# Patient Record
Sex: Male | Born: 1960 | Race: Black or African American | Hispanic: No
Health system: Northeastern US, Academic
[De-identification: ages and names within clinical notes are randomized; demographics above are authoritative.]

## PROBLEM LIST (undated history)

## (undated) DIAGNOSIS — J45909 Unspecified asthma, uncomplicated: Secondary | ICD-10-CM

## (undated) HISTORY — PX: LIPOMA RESECTION: SHX23

## (undated) HISTORY — DX: Unspecified asthma, uncomplicated: J45.909

---

## 2006-01-29 DIAGNOSIS — K219 Gastro-esophageal reflux disease without esophagitis: Secondary | ICD-10-CM

## 2006-01-29 DIAGNOSIS — J45909 Unspecified asthma, uncomplicated: Secondary | ICD-10-CM | POA: Insufficient documentation

## 2006-01-29 DIAGNOSIS — J309 Allergic rhinitis, unspecified: Secondary | ICD-10-CM | POA: Insufficient documentation

## 2006-01-29 HISTORY — DX: Gastro-esophageal reflux disease without esophagitis: K21.9

## 2006-03-27 DIAGNOSIS — IMO0001 Reserved for inherently not codable concepts without codable children: Secondary | ICD-10-CM | POA: Insufficient documentation

## 2006-03-27 DIAGNOSIS — J209 Acute bronchitis, unspecified: Secondary | ICD-10-CM | POA: Insufficient documentation

## 2006-10-31 DIAGNOSIS — H60399 Other infective otitis externa, unspecified ear: Secondary | ICD-10-CM | POA: Insufficient documentation

## 2006-10-31 DIAGNOSIS — R0789 Other chest pain: Secondary | ICD-10-CM | POA: Insufficient documentation

## 2006-11-22 DIAGNOSIS — L259 Unspecified contact dermatitis, unspecified cause: Secondary | ICD-10-CM | POA: Insufficient documentation

## 2007-01-31 ENCOUNTER — Encounter: Payer: Self-pay | Admitting: Cardiology

## 2007-02-28 ENCOUNTER — Encounter: Payer: Self-pay | Admitting: Cardiovascular Disease

## 2007-04-04 DIAGNOSIS — E7800 Pure hypercholesterolemia, unspecified: Secondary | ICD-10-CM | POA: Insufficient documentation

## 2007-04-04 HISTORY — DX: Pure hypercholesterolemia, unspecified: E78.00

## 2007-09-18 ENCOUNTER — Encounter: Payer: Self-pay | Admitting: Cardiology

## 2007-12-31 DIAGNOSIS — J45909 Unspecified asthma, uncomplicated: Secondary | ICD-10-CM | POA: Insufficient documentation

## 2008-05-05 DIAGNOSIS — E559 Vitamin D deficiency, unspecified: Secondary | ICD-10-CM

## 2008-05-05 HISTORY — DX: Vitamin D deficiency, unspecified: E55.9

## 2009-07-16 ENCOUNTER — Ambulatory Visit: Payer: Self-pay | Admitting: Primary Care

## 2009-08-31 ENCOUNTER — Ambulatory Visit: Payer: Self-pay | Admitting: Primary Care

## 2009-09-22 ENCOUNTER — Encounter: Payer: Self-pay | Admitting: Gastroenterology

## 2009-10-11 ENCOUNTER — Ambulatory Visit: Payer: Self-pay | Admitting: Primary Care

## 2009-10-11 NOTE — Progress Notes (Signed)
 Reason For Visit   REASON FOR VISIT:  follow up visit. Carl Olson is a 59 year year old male who   presents today with complaints of   hypercholesterolemia.  h.emler lpn.  HPI   49 year old male with a history of hypercholesterolemia bronchial asthma   and GERD who is here for followup.  He states he is not had any increased   wheezing shortness of breath but just have a mild nonproductive cough at   times.  He continues to take Prilosec regularly and denies any epigastric   distress or nausea.  He also. denies any muscle aches and continues on   simvastatin regularly.  Allergies   Avelox TABS.  Current Meds   Nasonex 50 MCG/ACT Suspension;USE 2 SPRAYS IN EACH NOSTRIL ONCE DAILY; Rx  Caltrate 600+D 600-400 MG-UNIT Tablet;TAKE 1 TABLET TWICE DAILY; RPT  Vitamin D 1000 UNIT Capsule;take one tablet three time a week; Rx  Omeprazole 20 MG Capsule Delayed Release;TAKE 1 CAPSULE DAILY.; Rx  Simvastatin 20 MG Tablet;TAKE 1 TABLET DAILY.; Rx  Fexofenadine HCl 180 MG Tablet;TAKE 1 TABLET DAILY.; Rx  Flovent HFA 110 MCG/ACT Aerosol;INHALE 1 PUFF TWICE DAILY.; Rx  Ventolin HFA 108 (90 Base) MCG/ACT Aerosol Solution;Inhale two puffs four   times a day as needed.; Rx.  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  Vital Signs   Recorded by EMLER,HOLLY on 11 Oct 2009 04:16 PM  BP:132/78,  LUE,  Sitting,   HR: 82 b/min,  L Radial, Normal,   Resp: 12 r/min, Normal,   Weight: 197.8 lb,   Pain Scale: 0.  Physical Exam   GENERAL APPEARANCE: Appears stated age, well appearing, NAD obese male  HEENT no carotid bruits or jugular venous distention  LUNGS: Clear to auscultation  and percussion  HEART: Normal S1,S2 without murmurs, gallops, or rubs  ABDOMEN: NABS, soft,no epigastric tenderness   EXTREMITIES: Without clubbing, cyanosis, or edema  NEUROLOGIC: Alert and oriented.  Assessment   Assessment and plan bronchial asthma.  Patient to continue his current dose   of Flovent and fexofenadine.  He will use Ventolin as needed.  2 hypercholesterolemia.  Patient has not had recent lab test and is given a   requisition to do a fasting lipid profile.  3 GERD.  Patient has relatively good control of his GI symptoms and will   decrease his Prilosec every other day.  He will return for followup in 3 months.  Signature   Electronically signed by: Leitha Bleak  M.D.; 10/11/2009 5:49 PM EST.

## 2009-10-12 ENCOUNTER — Ambulatory Visit
Admit: 2009-10-12 | Discharge: 2009-10-12 | Disposition: A | Discharge status: Home / Self Care | Payer: Self-pay | Source: Ambulatory Visit | Attending: Primary Care | Admitting: Primary Care

## 2009-10-12 LAB — COMPREHENSIVE METABOLIC PANEL
ALT: 30 U/L (ref 0–50)
AST: 26 U/L (ref 0–50)
Albumin: 5.2 g/dL (ref 3.5–5.2)
Alk Phos: 56 U/L (ref 40–130)
Anion Gap: 9 (ref 7–16)
Bilirubin,Total: 0.4 mg/dL (ref 0.0–1.2)
CO2: 28 mmol/L (ref 20–28)
Calcium: 9.3 mg/dL (ref 8.6–10.2)
Chloride: 102 mmol/L (ref 96–108)
Creatinine: 1.13 mg/dL (ref 0.67–1.17)
GFR,Black: >59 *
GFR,Caucasian: >59 *
Glucose: 96 mg/dL (ref 74–106)
Lab: 12 mg/dL (ref 6–20)
Potassium: 4.6 mmol/L (ref 3.3–5.1)
Sodium: 139 mmol/L (ref 133–145)
Total Protein: 6.9 g/dL (ref 6.3–7.7)

## 2009-10-12 LAB — LIPID PANEL
Chol/HDL Ratio: 3.2
Cholesterol: 175 mg/dL
HDL: 54 mg/dL
LDL Calculated: 111 mg/dL
Non HDL Cholesterol: 121 mg/dL
Triglycerides: 48 mg/dL

## 2009-10-15 LAB — VITAMIN D
25-OH VIT D2: <4 ng/mL
25-OH VIT D3: 25 ng/mL
25-OH Vit Total: 25 ng/mL — ABNORMAL LOW (ref 30–80)

## 2010-01-12 ENCOUNTER — Ambulatory Visit: Payer: Self-pay | Admitting: Primary Care

## 2010-03-28 ENCOUNTER — Ambulatory Visit: Payer: Self-pay | Admitting: Cardiology

## 2010-03-31 ENCOUNTER — Ambulatory Visit
Admit: 2010-03-31 | Discharge: 2010-03-31 | Disposition: A | Discharge status: Home / Self Care | Payer: Self-pay | Source: Ambulatory Visit | Attending: Cardiology | Admitting: Cardiology

## 2010-03-31 ENCOUNTER — Ambulatory Visit: Admit: 2010-03-31 | Discharge: 2010-03-31 | Disposition: A | Discharge status: Home / Self Care | Payer: Self-pay

## 2010-03-31 ENCOUNTER — Ambulatory Visit
Admit: 2010-03-31 | Discharge: 2010-03-31 | Disposition: A | Discharge status: Home / Self Care | Payer: Self-pay | Admitting: Cardiology

## 2010-03-31 NOTE — Progress Notes (Signed)
Correspondence   Dear Dr. Leitha Bleak:     On March 31, 2010 I had the pleasure of seeing your patient Carl Olson   in the [facility]. As you know, [name] is a 49 year old   male with a history of hyperlipidemia and chest pain.     Carl Olson presents in followup of his hyperlipidemia and chest discomfort.    He states that overall he is feeling well.  He occasionally notes some   sharp central chest pain, most commonly occurring at rest after a   particularly busy week at work.  He has not noted any chest pain or   shortness of breath with exertion.  He does not exercise regularly.  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  ROS   CONSTITUTIONAL: Appetite good, no fevers, night sweats or weight loss  EYES: No visual changes, no eye pain  ENT: No hearing difficulties, no ear pain  CV: See history of present illness.  No shortness of breath or peripheral   edema  RESPIRATORY: No cough, wheezing or dyspnea  GI: No nausea/vomiting, abdominal pain, or change in bowel habits  GU: No dysuria, urgency or incontinence  MS: No joint pain/swelling or musculoskeletal deformities  SKIN: No rashes  NEURO: No MS changes, no motor weakness, no sensory changes  PSYCH: No depression or anxiety  ENDOCRINE: No polyuria/polydipsia, no heat intolerance  HEME/LYMPH: No easy bleeding/bruising or swollen nodes  ALL/IMMUN: No allergic reactions.  Allergies   Avelox TABS.  Current Meds   Fexofenadine HCl 180 MG Tablet;TAKE 1 TABLET DAILY.; Rx  Simvastatin 20 MG Tablet;TAKE 1 TABLET DAILY.; Rx  Omeprazole 20 MG Capsule Delayed Release;TAKE 1  CAPSULE DAILY.; Rx  Flovent HFA 110 MCG/ACT Aerosol;INHALE 1 PUFF TWICE DAILY.; Rx  Ergocalciferol 50000 UNIT Capsule;Take by mouth twice a week for three   months; Rx  Caltrate 600+D 600-400 MG-UNIT Tablet;TAKE 1 TABLET TWICE DAILY; Rx  Nasonex 50 MCG/ACT Suspension;USE 2 SPRAYS IN EACH NOSTRIL DAILY; Rx  Aspirin 81 MG Tablet;TAKE 1 TABLET DAILY.; RPT.  ** Medication reconciliation completed and patient declined printed list.   **.  Vital Signs   Recorded by [email or web address] on 31 Mar 2010 08:16 AM  BP:114/70,  RUE,  Supine,   HR: 89 b/min,  Apical,   Height: 61 in, Weight: 192.8 lb, BMI: 36.4 kg/m2,   Pain Scale: 0.  The patient's identity was confirmed using at least two seperate   identifiers : yes.  Physical Exam   GENERAL: Pleasant, well appearing African American male. NAD. Color good.  NECK: Trachea midline, no stridor. Carotid upstrokes normal with no bruits.   JVP normal.  RESPIRATORY: Normal bilateral breath sounds without wheezes, rales or   rhonchi. No dullness to percussion.  COR: Normal precordium and PMI. Normal S1 and S2, regular. No murmur, rub,   or gallops.  ABD: obese, soft, nontender, nondistended, normoactive bowel sounds, no   organomegaly.  VASCULAR: 2+ pulses at radial, dorsalis pedis, and posterior tibial pulses   bilaterally. Capillary refill within 2 seconds. No signs of venous   insufficiency.   EXTREMITIES: No cyanosis, clubbing, or edema.  Assessment   Carl Olson is a 49 year old American male with coronary risk factors of   hyperlipidemia and obesity.  He has occasional chest discomfort, which is   most likely noncardiac in origin.  He had a stress echocardiogram today   (full report to follow) that showed normal LV systolic function at rest and   no stress-induced ischemia at a diagnostic cardiac workload.  Plan   #1 Chest discomfort- His stress echo being negative it is very reassuring.    I do not feel the need for further followup stress test unless he has new   symptoms.     #2  Hyperlipidemia-His LDL is nicely controlled on simvastatin.  I   encouraged him to increase with exercise in hopes of weight loss.     I will see Carl Olson in followup in one year at his request.     Thank you for allowing me to participate in  Carl Olson's  care. If you   have any questions or concerns please feel free to call our office.     Sincerely,  Honor Junes, MD.  Signature   Electronically signed by: Honor Junes  M.D.; 03/31/2010 9:33 AM EST;   Chartered loss adjuster.

## 2010-03-31 NOTE — Procedures (Signed)
[facility] Cardiology   STRESS ECHOCARDIOGRAM     [name]  MRN: [id number]  ID: [id number]  DOB: 08-08-1961  PROCEDURE DATE: March 31, 2010  REFERRING PROVIDER: Leitha Bleak     HT: 58 in  WT: 192.8 lbs        Medications:  [employer] Med List reviewed with patient   Allergies:   Avelox; Pt denied Latex;        Location: Outpatient   Technical Quality: Diagnostic.     Echo contrast:  Not used.      Indication: Chest pain.      The patient's identity was confirmed using at least two separate   identifiers during this visit and appropriate test/procedure was verified   with physician order : yes( Order comfirmed with Dr. Delton See)     Patient instructed and verbalizes understanding of test procedure: Yes     Other than cardiac symptoms, is patient experiencing pain that impacts   their ability to complete the test?  No    If yes, rate on 0-10 scale:       CARDIAC RISK FACTORS:, Hyperlipidemia     Obesity;     CARDIAC HISTORY   Myocardial infarction: None.   Cardiac catheterization: None.   CABG: None.   PCI/stenting: None.   Prior stress testing:  Stress echo - Neg. in 02/2008        HEMODYNAMICS     BASELINE / STRESS       --Resting heart rate: 89 bpm                                         --Max heart rate: 170 bpm       --Resting blood pressure: 114/70 mmHg       --Maximum blood pressure:  174/80 mmHg       --Resting EKG analysis: Normal sinus rhythm.       --Percent of predicted maximum heart rate achieved: 106% percent            STRESS TEST PROCEDURE: Standard Bruce   per protocol          --Exercise Time: 9 min 45 seconds       --METS: 10.1        --Blood Pressure Response:Normal.         --Heart rate response: Normal.          --ECG analysis with stress: No ischemic changes noted.          --Arrhythmias: A rare PVC noted.       --Chest Pain: None reported.        --Reason for termination: Fatigue. Target heart rate reached.        DRUGS ADMINISTERED: None.      Comments: Normal exercise capacity;  Appropriate  Heart rate and B/P   response to exercise;          BASELINE ECHOCARDIOGRAM     Left ventricular wall motion:  Normal resting left ventricular wall motion.   LVEF:  55-60 percent    RVdd: 3.1cm; LVed: 4.5cm; IVSd: 1.4cm; PWd: 1.2cm; Ao: 3.3cm; LA: 3.6cm      Mitral regurgitation:  Trace.     Aortic regurgitation: None.      Tricuspid regurgitation:  Trace.      Pulmonic regurgitation: None.  Right ventricular systolic pressure estimate: 21 mmHg  Other Findings:   Mild left ventricular hypertrophy.                      ECHOCARDIOGRAM AT PEAK EXERCISE     Left ventricular wall motion:  Hyperdynamic in all segments with   appropriate overall reduction in LV end diastolic dimension.   LVEF: 70 percent  Stress-induced ischemia: None.    OTHER: None       CONCLUSIONS  1. Normal aerobic capacity.  2. Normal resting ECG and normal resting LV systolic function.  3. Negative stress ECG for ischemia at 100% of max predicted heart rate.  4. Negative stress echocardiogram for myocardial ischemia at workload   achieved.     Sonographer:    Ancil Linsey RDCS, RVT         Tech / RN:    Jerilynn Som RN       Interpreting Cardiologist:  Honor Junes M.D.  Signature   Electronically signed by: Honor Junes  M.D.; 03/31/2010 9:36 AM EST;   Chartered loss adjuster.

## 2010-05-10 ENCOUNTER — Ambulatory Visit: Payer: Self-pay | Admitting: Primary Care

## 2010-05-11 NOTE — Progress Notes (Signed)
 Reason For Visit   REASON FOR VISIT:  Carl Olson is a 49 year year old male who presents today    for an acute visit for cough and sore throat. Holly Emler/LPN.  HPI   49 year old male who comes in with chief complaints of a sore throat fevers   and productive cough for the last 3 days.  He denies any increased   shortness of breath or wheezing.  Allergies   Avelox TABS.  Current Meds   Simvastatin 20 MG Tablet;take 1 tablet by mouth daily; Rx  Aspirin 81 MG Tablet;TAKE 1 TABLET DAILY.; RPT  Nasonex 50 MCG/ACT Suspension;USE 2 SPRAYS IN EACH NOSTRIL DAILY; Rx  Caltrate 600+D 600-400 MG-UNIT Tablet;TAKE 1 TABLET TWICE DAILY; Rx  Ergocalciferol 50000 UNIT Capsule;Take by mouth twice a week for three   months; Rx  Flovent HFA 110 MCG/ACT Aerosol;INHALE 1 PUFF TWICE DAILY.; Rx  Fexofenadine HCl 180 MG Tablet;TAKE 1 TABLET DAILY.; Rx  Omeprazole 20 MG Capsule Delayed Release;TAKE 1 CAPSULE DAILY; Rx.  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  Vital Signs   Recorded by EMLER,HOLLY on 10 May 2010 02:07 PM  BP:130/84,  RUE,  Sitting,   HR: 96 b/min,  R Radial, Normal,   Temp: 98.6 F,   Height: 61.5 in, Weight: 196 lb, BMI: 36.4 kg/m2,   Pain Scale: 0.  Physical Exam   GENERAL: Appears well. NAD. Color good. No cyanosis.  EARS: Canals clear, tympanic membranes normal appearing, no effusions.  NOSE: Nares patent, septum straight, normal appearing mucosae, no exudate.  THROAT: Normal appearing mucosae, palate, and tongue. Tonsils are normal   appearing with no exudates, masses or ulcerations.  NECK: Trachea midline. No lymphadenopathy. Full ROM without pain or    tenderness.  RESPIRATORY: Bilateral scattered rhonchi and wheezes   COR: Normal precordium and PMI. Normal S1 and S2. No murmurs, rubs, or   gallops.  Assessment   Assessment and plan Will treat for possible bacterial bronchitis in this   patient with a history of allergic rhinitis.  He was given Zithromax 2   tablets initially followed by one tablet daily for the next 4 days.    Patient to continue his current medications for his allergies.  He would   return for followup as scheduled.  Signature   Electronically signed by: Leitha Bleak  M.D.; 05/11/2010 4:33 PM EST.

## 2010-08-23 NOTE — Miscellaneous (Unsigned)
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 ; RPT   Azithromycin 250 MG Tablet; TAKE 2 TABLETS ON DAY 1 THEN TAKE 1 TABLET A   DAY FOR 4 DAYS. ; Rx   Caltrate 600+D 600-400 MG-UNIT Tablet; TAKE 1 TABLET TWICE DAILY ; Rx   Ergocalciferol 50000 UNIT Capsule; Take by mouth twice a week for three   months ; Rx   Fexofenadine HCl 180 MG Tablet; TAKE 1 TABLET DAILY. ; Rx   Flovent HFA 110 MCG/ACT Aerosol; INHALE 1 PUFF TWICE DAILY. ; Rx   Nasonex 50 MCG/ACT Suspension; USE 2 SPRAYS IN EACH NOSTRIL ONCE DAILY ; Rx   Omeprazole 20 MG Capsule Delayed Release; TAKE 1 CAPSULE DAILY ; Rx   PredniSONE 20 MG Tablet; Take one tablet twice daily for one week ; Rx   Simvastatin 20 MG Tablet; take 1 tablet by mouth daily ; Rx     Immunizations  Influenza (Whole)   Tdap (Adacel)   Influenza   Influenza

## 2010-09-20 ENCOUNTER — Ambulatory Visit: Payer: Self-pay | Admitting: Primary Care

## 2010-09-20 ENCOUNTER — Encounter: Payer: Self-pay | Admitting: Primary Care

## 2010-09-20 NOTE — Progress Notes (Signed)
 Reason For Visit   REASON FOR VISIT:  Carl Olson is a 50 year year old male who presents today   with complaints of  cough, headache, eyes burning for 4 days. Daphne A.   Beverely Pace LPN      50 year old patient was seen on emergency basis with a one-week history of   cough with yellowish expectoration, chest pain shortness of breath and   wheezing.  He has history of asthma for which he takes Flovent 110 mcg once   a day even though he was advised to take it twice a day.  He also has   perennial allergic rhinitis and has been advised to take Nasonex which he   claims is taking.  Complains of sinus congestion stuffy nose but denies any   facial pain headaches, fever chills.  He is a nonsmoker.  His appetite is   fair.  Allergies   Avelox TABS.  Current Meds   Fexofenadine HCl 180 MG Tablet;TAKE 1 TABLET DAILY.; Rx  Flovent HFA 110 MCG/ACT Aerosol;INHALE 1 PUFF TWICE DAILY.; Rx  Ergocalciferol 50000 UNIT Capsule;Take by mouth twice a week for three   months; Rx  Caltrate 600+D 600-400 MG-UNIT Tablet;TAKE 1 TABLET TWICE DAILY; Rx  Nasonex 50 MCG/ACT Suspension;USE 2 SPRAYS IN EACH NOSTRIL DAILY; Rx  Aspirin 81 MG Tablet;TAKE 1 TABLET DAILY.; RPT  Omeprazole 20 MG Capsule Delayed Release;TAKE 1 CAPSULE DAILY; Rx  Simvastatin 20 MG Tablet;take 1 tablet by mouth daily; Rx  Azithromycin 250 MG Tablet;TAKE 2 TABLETS ON DAY 1 THEN TAKE 1 TABLET A DAY   FOR 4 DAYS.; Rx  PredniSONE 20 MG Tablet;Take one tablet twice daily for one week; Rx.  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  Vital Signs   Recorded by [facility] on 20 Sep 2010 09:12 AM  BP:100/50,  RUE,   Sitting,   HR: 96 b/min,  R Radial,   Temp: 98.7 F,  Tympanic,   Height: 61 in, Weight: 200 lb, BMI: 37.8 kg/m2.  Physical Exam    GENERAL: Patient is afebrile  HEENT: Sinuses are nontender  EARS: Canals clear, tympanic membranes normal appearing, no effusions.  NOSE: Nasal mucosa is injected.  THROAT: Oropharynx is injected but no exudate noted.  NECK: Trachea midline. No lymphadenopathy. Full ROM without pain or   tenderness.  RESPIRATORY: He has diffuse rhonchi and wheezes over the lung fields.  COR: Normal precordium and PMI. Normal S1 and S2. No murmurs, rubs, or   gallops.  Assessment   ASSESSMENT:   1.  Patient has acute bronchitis with acute exacerbation of his bronchial   asthma.  He is allergic to Augmentin and therefore was prescribed   azithromycin 250 mg 2 tablets on day one and one tablet daily for the next   4 days.  He was advised to drink plenty of fluids and take 2 Tylenol 4   times a day as needed.  Patient was also advised to increase the dose of   Flovent to one puff twice a day, continue with his Nasonex and Singulair.    He was also prescribed prednisone 20 mg twice a day for one week and  advised to follow up with Dr. Valla Leaver in 2 weeks.  Orders   Azithromycin 250 MG Tablet;TAKE 2 TABLETS ON DAY 1 THEN TAKE 1 TABLET A DAY   FOR 4 DAYS; Qty6; R0; Rx.  PredniSONE 20 MG Tablet;Take one tablet twice daily for one week; Qty14;   R0; Rx.  Signature   Electronically signed by: Shearon Balo  M.D.; 09/20/2010 9:47 AM EST.

## 2010-10-10 ENCOUNTER — Ambulatory Visit: Payer: Self-pay | Admitting: Primary Care

## 2011-02-09 ENCOUNTER — Other Ambulatory Visit: Payer: Self-pay | Admitting: Primary Care

## 2011-03-28 ENCOUNTER — Ambulatory Visit: Admit: 2011-03-28 | Payer: Self-pay | Source: Ambulatory Visit | Admitting: Cardiology

## 2011-03-31 ENCOUNTER — Ambulatory Visit: Payer: Self-pay | Admitting: Cardiology

## 2011-05-01 ENCOUNTER — Other Ambulatory Visit: Payer: Self-pay | Admitting: Primary Care

## 2011-06-05 ENCOUNTER — Telehealth: Payer: Self-pay | Admitting: Primary Care

## 2011-06-05 ENCOUNTER — Ambulatory Visit: Payer: Self-pay | Admitting: Primary Care

## 2011-06-05 ENCOUNTER — Encounter: Payer: Self-pay | Admitting: Primary Care

## 2011-06-05 VITALS — BP 137/80 | HR 80 | Ht 61.75 in | Wt 194.0 lb

## 2011-06-05 DIAGNOSIS — E559 Vitamin D deficiency, unspecified: Secondary | ICD-10-CM

## 2011-06-05 DIAGNOSIS — Z139 Encounter for screening, unspecified: Secondary | ICD-10-CM

## 2011-06-05 DIAGNOSIS — E7800 Pure hypercholesterolemia, unspecified: Secondary | ICD-10-CM

## 2011-06-05 DIAGNOSIS — K219 Gastro-esophageal reflux disease without esophagitis: Secondary | ICD-10-CM

## 2011-06-05 DIAGNOSIS — J309 Allergic rhinitis, unspecified: Secondary | ICD-10-CM

## 2011-06-05 DIAGNOSIS — Z23 Encounter for immunization: Secondary | ICD-10-CM

## 2011-06-05 MED ORDER — VITAMIN D 2000 UNIT PO TABS *I*
2000.0000 [IU] | ORAL_TABLET | Freq: Every day | ORAL | Status: DC
Start: 2011-06-05 — End: 2011-06-13

## 2011-06-05 NOTE — Telephone Encounter (Signed)
Patient states at check out he was to have an order for labs, please send order

## 2011-06-05 NOTE — Progress Notes (Signed)
Subjective:      Patient ID: Carl Olson is a 50 y.o. male    HPI14 year old male with a history of GERD and bronchial asthma in the past who is here for followup.  He denies any increased reflux symptoms as long as he avoids certain foods and no increased problems with asthma or allergies.  He is here because he states his wife was diagnosed with hepatitis C and he needs to be checked out.  Patient himself denies any GI symptoms muscle aches    Current Outpatient Prescriptions   Medication Sig Dispense Refill   . simvastatin (ZOCOR) 20 MG tablet TAKE 1 TABLET BY MOUTH DAILY  30 tablet  0   . fluticasone (FLOVENT HFA) 110 MCG/ACT inhaler Inhale 1 puff into the lungs 2 times daily    12 g  0   . aspirin 81 MG tablet TAKE 1 TABLET DAILY.    0   . mometasone (NASONEX) 50 MCG/ACT nasal spray USE 2 SPRAYS IN EACH NOSTRIL DAILY  17  9   . omeprazole (PRILOSEC) 20 MG capsule TAKE 1 CAPSULE DAILY  30  5   . Calcium Carbonate-Vitamin D (CALTRATE 600+D) 600-400 MG-UNIT per tablet TAKE 1 TABLET TWICE DAILY  60  5   . fexofenadine (ALLEGRA) 180 MG tablet TAKE 1 TABLET DAILY.  30  5   . cholecalciferol (VITAMIN D) 2000 UNITS tablet Take 1 tablet (2,000 Units total) by mouth daily    100 capsule           Allergies   Allergen Reactions   . Moxifloxacin      Created by Conversion - 0;        Review of Systems  Negative for fevers chills      Objective:     Filed Vitals:    06/05/11 1619   BP: 137/80   Pulse: 80   Height: 1.568 m (5' 1.75")   Weight: 87.998 kg (194 lb)             Physical Exam   Vitals reviewed.  Constitutional: He appears well-developed and well-nourished.   HENT:   Head: Normocephalic.   Right Ear: Tympanic membrane normal.   Left Ear: Tympanic membrane normal.   Eyes: EOM are normal. No scleral icterus.   Neck: Normal range of motion. Neck supple. No JVD present. Carotid bruit is not present. No thyromegaly present.   Cardiovascular: Normal rate, regular rhythm and normal heart sounds.    No murmur  heard.  Pulmonary/Chest: Effort normal and breath sounds normal. He has no wheezes. He has no rales.   Abdominal: Soft. Bowel sounds are normal. He exhibits no distension.        No hepatosplenomegaly   Musculoskeletal: Normal range of motion.   Lymphadenopathy:     He has no cervical adenopathy.   Neurological: He is alert. He has normal reflexes. No cranial nerve deficit.   Skin: Skin is warm and dry.   Psychiatric: He has a normal mood and affect.         Assessment and plan hypercholesterolemia.  Patient has not had any recent blood test and was given a requisition to obtain a lipid profile and LFTs.  2Vitamin D deficiency; patient Will obtain a vitamin D level.  3 GERD.  Patient to  continue his bland diet and PPI.  As needed  4 history of allergic rhinitis stable..   patient not having any bronchial asthma symptoms at this time  5history  of exposure to hepatitis C.  We will check his hepatitis C level as well as do hepatitis B. Testing.  Patient was received pretest counseling for HIV.  Followup in 3 months.  He was given a flu shot today.

## 2011-06-07 ENCOUNTER — Ambulatory Visit
Admit: 2011-06-07 | Discharge: 2011-06-07 | Disposition: A | Discharge status: Home / Self Care | Payer: Self-pay | Source: Ambulatory Visit | Attending: Primary Care | Admitting: Primary Care

## 2011-06-07 DIAGNOSIS — E559 Vitamin D deficiency, unspecified: Secondary | ICD-10-CM

## 2011-06-07 DIAGNOSIS — E7800 Pure hypercholesterolemia, unspecified: Secondary | ICD-10-CM

## 2011-06-07 DIAGNOSIS — Z139 Encounter for screening, unspecified: Secondary | ICD-10-CM

## 2011-06-07 LAB — LIPID PANEL
Chol/HDL Ratio: 3.2
Cholesterol: 171 mg/dL
HDL: 54 mg/dL
LDL Calculated: 106 mg/dL
Non HDL Cholesterol: 117 mg/dL
Triglycerides: 56 mg/dL

## 2011-06-07 LAB — CBC AND DIFFERENTIAL
Baso # K/uL: 0 10*3/uL (ref 0.0–0.1)
Basophil %: 0.2 % (ref 0.2–1.2)
Eos # K/uL: 0.1 10*3/uL (ref 0.0–0.5)
Eosinophil %: 0.6 % — ABNORMAL LOW (ref 0.8–7.0)
Hematocrit: 44 % (ref 40–51)
Hemoglobin: 14.3 g/dL (ref 13.7–17.5)
Lymph # K/uL: 2 10*3/uL (ref 1.3–3.6)
Lymphocyte %: 23.6 % (ref 21.8–53.1)
MCV: 87 fL (ref 79–92)
Mono # K/uL: 0.5 10*3/uL (ref 0.3–0.8)
Monocyte %: 6.5 % (ref 5.3–12.2)
Neut # K/uL: 5.7 10*3/uL — ABNORMAL HIGH (ref 1.8–5.4)
Platelets: 255 10*3/uL (ref 150–330)
RBC: 5.1 MIL/uL (ref 4.6–6.1)
RDW: 13.6 % (ref 11.6–14.4)
Seg Neut %: 69.1 % — ABNORMAL HIGH (ref 34.0–67.9)
WBC: 8.3 10*3/uL (ref 4.2–9.1)

## 2011-06-07 LAB — COMPREHENSIVE METABOLIC PANEL
ALT: 20 U/L (ref 0–50)
AST: 16 U/L (ref 0–50)
Albumin: 4.7 g/dL (ref 3.5–5.2)
Alk Phos: 57 U/L (ref 40–130)
Anion Gap: 10 (ref 7–16)
Bilirubin,Total: 0.3 mg/dL (ref 0.0–1.2)
CO2: 28 mmol/L (ref 20–28)
Calcium: 9.2 mg/dL (ref 8.6–10.2)
Chloride: 104 mmol/L (ref 96–108)
Creatinine: 1.14 mg/dL (ref 0.67–1.17)
GFR,Black: >59 *
GFR,Caucasian: >59 *
Glucose: 94 mg/dL (ref 60–99)
Lab: 15 mg/dL (ref 6–20)
Potassium: 4.5 mmol/L (ref 3.3–5.1)
Sodium: 142 mmol/L (ref 133–145)
Total Protein: 6.7 g/dL (ref 6.3–7.7)

## 2011-06-07 LAB — TSH: TSH: 1.66 u[IU]/mL (ref 0.27–4.20)

## 2011-06-07 LAB — T4, FREE: Free T4: 1.3 ng/dL (ref 0.9–1.7)

## 2011-06-08 LAB — HEPATITIS B SURFACE ANTIGEN: HBV S Ag: NEGATIVE

## 2011-06-08 LAB — HEB B INT

## 2011-06-08 LAB — VITAMIN D
25-OH VIT D2: <4 ng/mL
25-OH VIT D3: 14 ng/mL
25-OH Vit Total: 14 ng/mL — ABNORMAL LOW (ref 30–80)

## 2011-06-08 LAB — HEPATITIS C ANTIBODY: Hep C Ab: NEGATIVE

## 2011-06-08 LAB — HEPATITIS B SURFACE ANTIBODY: HBV S Ab Quant: 9.6 IU/L

## 2011-06-08 LAB — HEPATITIS B CORE ANTIBODY, TOTAL: HBV Core Ab: NEGATIVE

## 2011-06-13 ENCOUNTER — Telehealth: Payer: Self-pay | Admitting: Primary Care

## 2011-06-13 ENCOUNTER — Other Ambulatory Visit: Payer: Self-pay | Admitting: Primary Care

## 2011-06-13 DIAGNOSIS — E559 Vitamin D deficiency, unspecified: Secondary | ICD-10-CM

## 2011-06-13 MED ORDER — ERGOCALCIFEROL 50000 UNIT PO CAPS *I*
50000.0000 [IU] | ORAL_CAPSULE | ORAL | Status: DC
Start: 2011-06-13 — End: 2011-12-05

## 2011-06-13 NOTE — Telephone Encounter (Signed)
Please add to mar, thank you

## 2011-06-13 NOTE — Telephone Encounter (Signed)
Message copied by Silvestre Mesi on Tue Jun 13, 2011 11:17 AM  ------       Message from: Leitha Bleak I       Created: Fri Jun 09, 2011 11:25 AM         Please notify patient that his hepatitis C is negative.he is vitamin D deficient and needs to start vitamin D 50,000 units weekly and calcium and vitamin D.  He will need repeat vitamin D level in 3 months.

## 2011-06-13 NOTE — Telephone Encounter (Signed)
Left message for patient to call the office

## 2011-06-14 ENCOUNTER — Telehealth: Payer: Self-pay | Admitting: Primary Care

## 2011-06-14 NOTE — Telephone Encounter (Signed)
Patient returning call, you can try his cell [phone]

## 2011-06-14 NOTE — Telephone Encounter (Signed)
Spoke with patient regarding neg hep c results and the need for vitamin D.

## 2011-06-21 ENCOUNTER — Other Ambulatory Visit: Payer: Self-pay | Admitting: Primary Care

## 2011-08-16 ENCOUNTER — Other Ambulatory Visit: Payer: Self-pay | Admitting: Primary Care

## 2011-08-23 ENCOUNTER — Ambulatory Visit: Payer: Self-pay | Admitting: Primary Care

## 2011-08-28 ENCOUNTER — Encounter: Payer: Self-pay | Admitting: Primary Care

## 2011-08-28 ENCOUNTER — Ambulatory Visit: Payer: Self-pay | Admitting: Primary Care

## 2011-08-28 VITALS — BP 137/82 | HR 93 | Ht 61.5 in | Wt 203.0 lb

## 2011-08-28 DIAGNOSIS — E559 Vitamin D deficiency, unspecified: Secondary | ICD-10-CM

## 2011-08-28 DIAGNOSIS — E7800 Pure hypercholesterolemia, unspecified: Secondary | ICD-10-CM

## 2011-08-28 DIAGNOSIS — J309 Allergic rhinitis, unspecified: Secondary | ICD-10-CM

## 2011-08-29 ENCOUNTER — Ambulatory Visit
Admit: 2011-08-29 | Discharge: 2011-08-29 | Disposition: A | Discharge status: Home / Self Care | Payer: Self-pay | Source: Ambulatory Visit | Attending: Primary Care | Admitting: Primary Care

## 2011-08-29 DIAGNOSIS — E559 Vitamin D deficiency, unspecified: Secondary | ICD-10-CM

## 2011-08-29 DIAGNOSIS — E7800 Pure hypercholesterolemia, unspecified: Secondary | ICD-10-CM

## 2011-08-29 LAB — COMPREHENSIVE METABOLIC PANEL
ALT: 23 U/L (ref 0–50)
AST: 23 U/L (ref 0–50)
Albumin: 4.9 g/dL (ref 3.5–5.2)
Alk Phos: 57 U/L (ref 40–130)
Anion Gap: 13 (ref 7–16)
Bilirubin,Total: 0.4 mg/dL (ref 0.0–1.2)
CO2: 27 mmol/L (ref 20–28)
Calcium: 10.6 mg/dL — ABNORMAL HIGH (ref 8.6–10.2)
Chloride: 103 mmol/L (ref 96–108)
Creatinine: 1.26 mg/dL — ABNORMAL HIGH (ref 0.67–1.17)
GFR,Black: >59 *
GFR,Caucasian: >59 *
Glucose: 84 mg/dL (ref 60–99)
Lab: 15 mg/dL (ref 6–20)
Potassium: 5.1 mmol/L (ref 3.3–5.1)
Sodium: 143 mmol/L (ref 133–145)
Total Protein: 7.1 g/dL (ref 6.3–7.7)

## 2011-08-29 LAB — LIPID PANEL
Chol/HDL Ratio: 3.3
Cholesterol: 189 mg/dL
HDL: 58 mg/dL
LDL Calculated: 119 mg/dL
Non HDL Cholesterol: 131 mg/dL
Triglycerides: 59 mg/dL

## 2011-08-31 LAB — VITAMIN D
25-OH VIT D2: 10 ng/mL
25-OH VIT D3: 9 ng/mL
25-OH Vit Total: 19 ng/mL — ABNORMAL LOW (ref 30–80)

## 2011-09-26 ENCOUNTER — Other Ambulatory Visit: Payer: Self-pay | Admitting: Primary Care

## 2011-09-26 NOTE — Telephone Encounter (Signed)
Last filled 1/2, last seen 1/14

## 2011-10-24 ENCOUNTER — Other Ambulatory Visit: Payer: Self-pay | Admitting: Primary Care

## 2011-11-01 ENCOUNTER — Other Ambulatory Visit: Payer: Self-pay | Admitting: Primary Care

## 2011-11-01 NOTE — Telephone Encounter (Signed)
Last seen 1/14, last filled 11/7

## 2011-11-28 ENCOUNTER — Ambulatory Visit: Payer: Self-pay | Admitting: Primary Care

## 2011-11-28 ENCOUNTER — Encounter: Payer: Self-pay | Admitting: Primary Care

## 2011-11-28 VITALS — BP 130/88 | HR 85 | Ht 61.5 in | Wt 200.0 lb

## 2011-11-28 DIAGNOSIS — E559 Vitamin D deficiency, unspecified: Secondary | ICD-10-CM

## 2011-11-28 DIAGNOSIS — Z139 Encounter for screening, unspecified: Secondary | ICD-10-CM

## 2011-11-28 DIAGNOSIS — J309 Allergic rhinitis, unspecified: Secondary | ICD-10-CM

## 2011-11-28 DIAGNOSIS — E785 Hyperlipidemia, unspecified: Secondary | ICD-10-CM

## 2011-11-28 LAB — HM HIV SCREENING OFFERED

## 2011-11-28 NOTE — Progress Notes (Signed)
Subjective:      Patient ID: Carl Olson is a 51 y.o. male    HPI47 year old male with a history of hypercholesterolemia GERD and problems with allergies who is here for followup.  He states that he tries to be on his diet and has been taking his simvastatin regularly.  He denies any increase in nasal congestion or shortness of breath and also didn't take the vitamin D when it was prescribed  Chief Complaint   Patient presents with   . Hyperlipidemia    ,   Current Outpatient Prescriptions on File Prior to Visit   Medication Sig Dispense Refill   . FLOVENT HFA 110 MCG/ACT inhaler INHALE ONE PUFF TWICE DAILY  12 g  0   . simvastatin (ZOCOR) 20 MG tablet TAKE 1 TABLET BY MOUTH EVERY DAY  30 tablet  5   . aspirin 81 MG tablet TAKE 1 TABLET DAILY.    0   . mometasone (NASONEX) 50 MCG/ACT nasal spray USE 2 SPRAYS IN EACH NOSTRIL DAILY  17  9   . Calcium Carbonate-Vitamin D (CALTRATE 600+D) 600-400 MG-UNIT per tablet TAKE 1 TABLET TWICE DAILY  60  5   . fexofenadine (ALLEGRA) 180 MG tablet TAKE 1 TABLET DAILY.  30  5   . ergocalciferol (DRISDOL) 50000 UNIT capsule Take 1 capsule (50,000 Units total) by mouth once a week    4 capsule  3   . omeprazole (PRILOSEC) 20 MG capsule TAKE 1 CAPSULE DAILY  30  5    and   Allergies   Allergen Reactions   . Moxifloxacin      Created by Conversion - 0;        Review of Systems  Negative for nasal congestion or epigastric discomfort      Objective:     Filed Vitals:    11/28/11 1456   BP: 130/88   Pulse: 85   Height: 1.562 m (5' 1.5")   Weight: 90.719 kg (200 lb)       Recent Results (from the past 72 hour(s))   HM HIV SCREENING OFFERED       Component Value Range    HM HIV SCREENING OFFERED Previously Done             Physical Exam   Vitals reviewed.  Constitutional:        Slightly obese male appearing his stated age   HENT:   Head: Normocephalic and atraumatic.   Eyes: EOM are normal.   Neck: Neck supple. No JVD present. No thyromegaly present.   Cardiovascular: Normal rate,  regular rhythm and normal heart sounds.    No murmur heard.  Pulmonary/Chest: Effort normal and breath sounds normal. He has no wheezes. He has no rales.   Abdominal: Soft. Bowel sounds are normal.   Musculoskeletal: Normal range of motion. He exhibits no edema.   Lymphadenopathy:     He has no cervical adenopathy.   Neurological: He is alert.       Assessment and plan hypercholesterolemia.  Patient to continue his simvastatin and obtain a lipid profile.  2 allergic rhinitis.  Patient to continue his allergy medications he takes Allegra at the present time.  3 history of vitamin D deficiency.  We will recheck vitamin D level.  Patient had signed in the past for HIV testing and this was not drawn we will order the lab test again.  Followup in 4 months

## 2011-11-29 ENCOUNTER — Ambulatory Visit
Admit: 2011-11-29 | Discharge: 2011-11-29 | Disposition: A | Discharge status: Home / Self Care | Payer: Self-pay | Source: Ambulatory Visit | Attending: Primary Care | Admitting: Primary Care

## 2011-11-29 DIAGNOSIS — E559 Vitamin D deficiency, unspecified: Secondary | ICD-10-CM

## 2011-11-29 DIAGNOSIS — Z139 Encounter for screening, unspecified: Secondary | ICD-10-CM

## 2011-11-29 DIAGNOSIS — E785 Hyperlipidemia, unspecified: Secondary | ICD-10-CM

## 2011-11-29 LAB — COMPREHENSIVE METABOLIC PANEL
ALT: 21 U/L (ref 0–50)
AST: 20 U/L (ref 0–50)
Albumin: 4.6 g/dL (ref 3.5–5.2)
Alk Phos: 54 U/L (ref 40–130)
Anion Gap: 10 (ref 7–16)
Bilirubin,Total: 0.5 mg/dL (ref 0.0–1.2)
CO2: 27 mmol/L (ref 20–28)
Calcium: 9.5 mg/dL (ref 8.6–10.2)
Chloride: 104 mmol/L (ref 96–108)
Creatinine: 1.32 mg/dL — ABNORMAL HIGH (ref 0.67–1.17)
GFR,Black: 72 *
GFR,Caucasian: 63 *
Glucose: 96 mg/dL (ref 60–99)
Lab: 15 mg/dL (ref 6–20)
Potassium: 4.4 mmol/L (ref 3.3–5.1)
Sodium: 141 mmol/L (ref 133–145)
Total Protein: 6.6 g/dL (ref 6.3–7.7)

## 2011-11-29 LAB — LIPID PANEL
Chol/HDL Ratio: 3.5
Cholesterol: 191 mg/dL
HDL: 55 mg/dL
LDL Calculated: 116 mg/dL
Non HDL Cholesterol: 136 mg/dL
Triglycerides: 100 mg/dL

## 2011-11-30 LAB — HIV-1 AND 2 AB: HIV 1&2 Ab screen: NEGATIVE

## 2011-12-04 LAB — VITAMIN D
25-OH VIT D2: 5 ng/mL
25-OH VIT D3: 15 ng/mL
25-OH Vit Total: 20 ng/mL — ABNORMAL LOW (ref 30–80)

## 2011-12-05 ENCOUNTER — Telehealth: Payer: Self-pay | Admitting: Primary Care

## 2011-12-05 DIAGNOSIS — E559 Vitamin D deficiency, unspecified: Secondary | ICD-10-CM

## 2011-12-05 MED ORDER — ERGOCALCIFEROL 50000 UNIT PO CAPS *I*
50000.0000 [IU] | ORAL_CAPSULE | ORAL | Status: DC
Start: 2011-12-05 — End: 2012-03-28

## 2011-12-05 NOTE — Telephone Encounter (Signed)
Patient is vitamin D deficient and needs to start vitamin D 50,000 units once a week for the next 3 months and then repeat blood tests.  Prescription and labs are ordered

## 2011-12-06 NOTE — Telephone Encounter (Signed)
Message for pt left to call office regarding lab results

## 2011-12-07 ENCOUNTER — Telehealth: Payer: Self-pay | Admitting: Primary Care

## 2011-12-07 NOTE — Telephone Encounter (Signed)
Pt. Given message he verbalized understanding

## 2011-12-07 NOTE — Telephone Encounter (Signed)
Patient called was given lab results and instructions for Vitamin D.

## 2011-12-15 ENCOUNTER — Other Ambulatory Visit: Payer: Self-pay | Admitting: Primary Care

## 2011-12-15 NOTE — Telephone Encounter (Signed)
Refill requested. Pharmacy verified. Patient last seen on 11/28/2011.

## 2012-02-14 IMAGING — CT Neck^01_ROUTINE_NECK (Adult)
2 series · 10 of 14 positions shown, 12 images · non-contrast
Comparison: none

[Series 2: neckroutine 5.0 b31s · axial · 0.39mm/px · z∈[+88,+178]mm · 3 of 36 slices shown]
[im 9/36  bone]
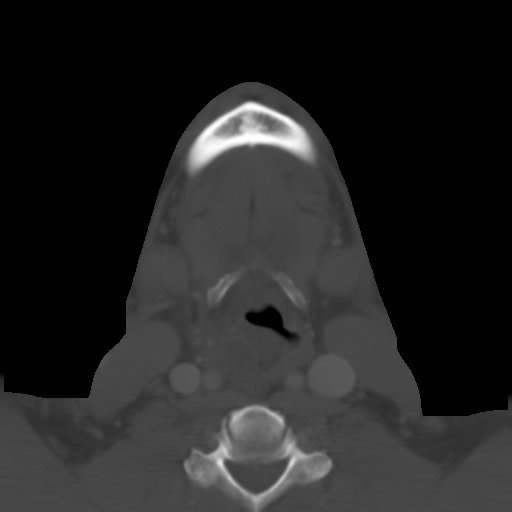
[im 18/36  bone]
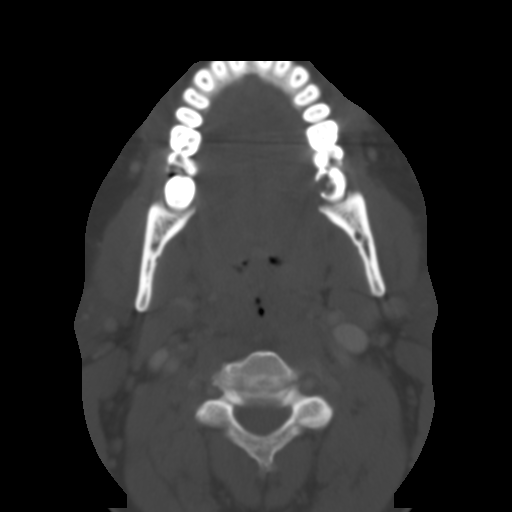
[im 27/36  bone]
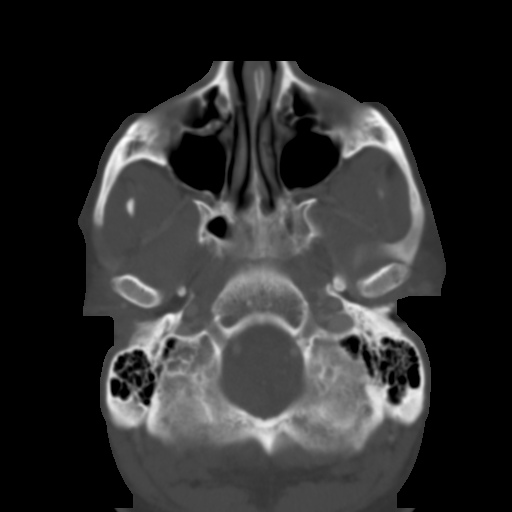

[Series 3: neckroutine 2.5 b31s · axial · 0.39mm/px · z∈[+66,+201]mm · 7 of 72 slices shown, 9 images]
[im 9/72  soft-tissue]
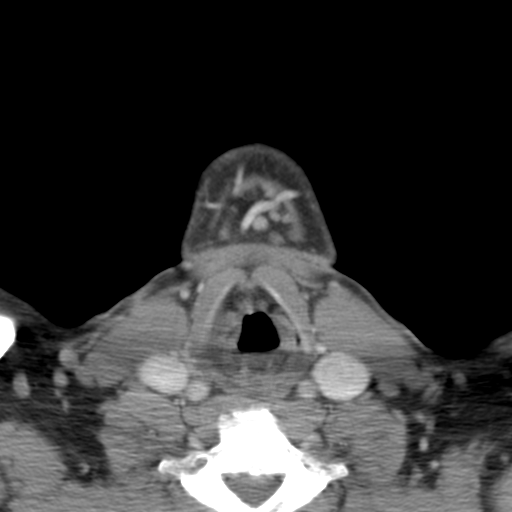
[im 9/72  bone]
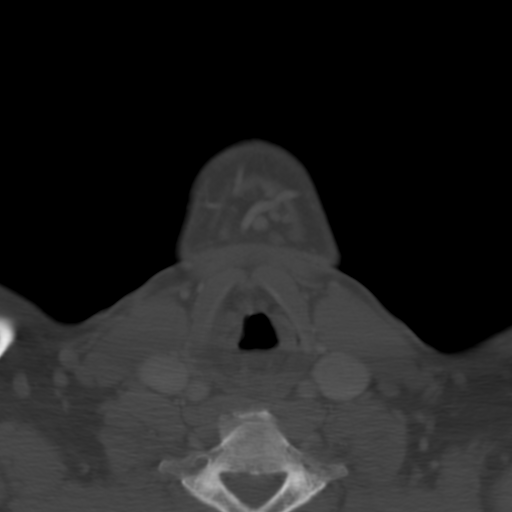
[im 18/72  bone]
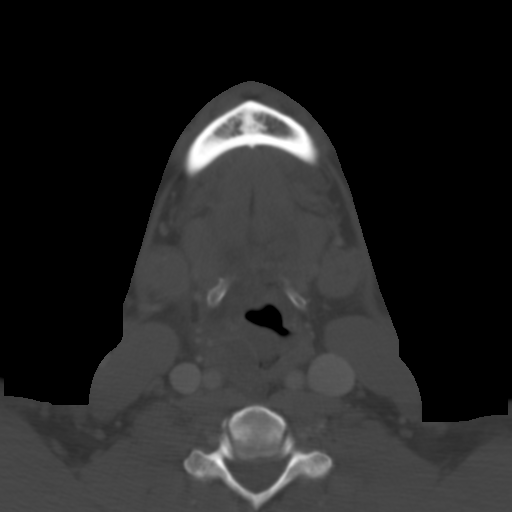
[im 27/72  bone]
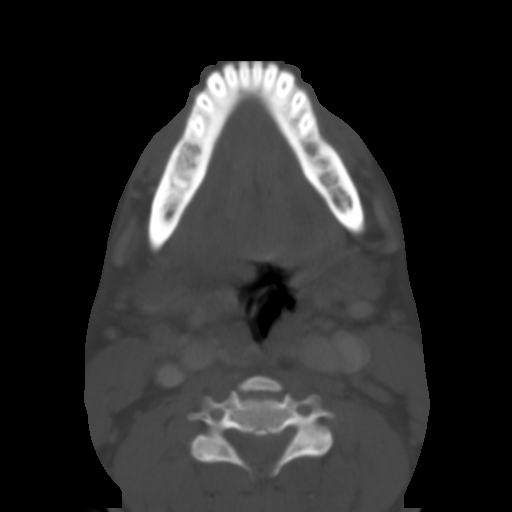
[im 36/72  bone]
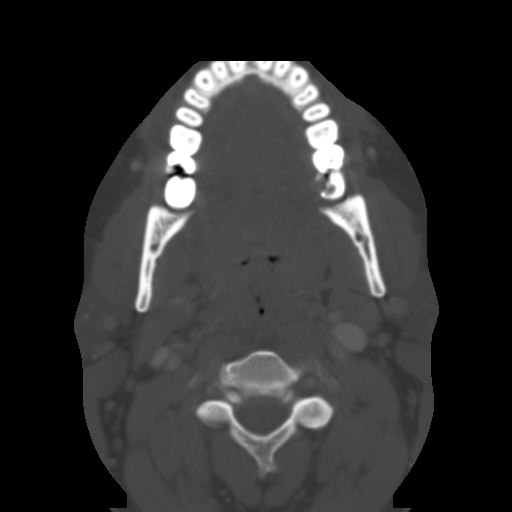
[im 45/72  soft-tissue]
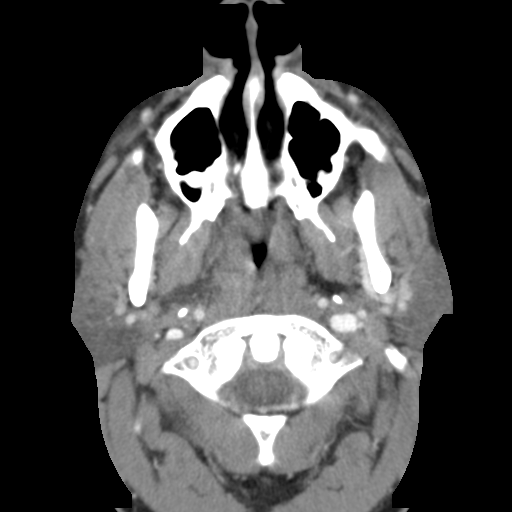
[im 45/72  bone]
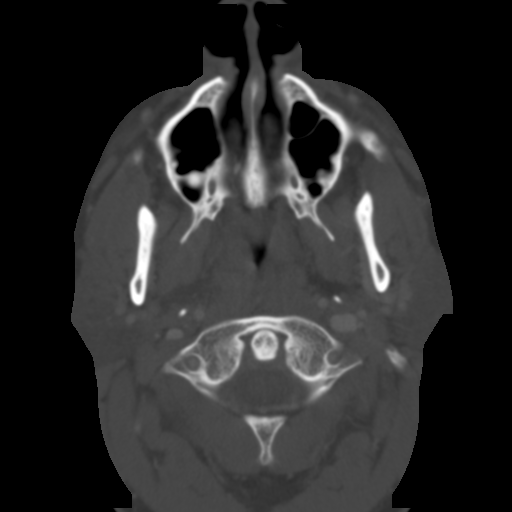
[im 54/72  bone]
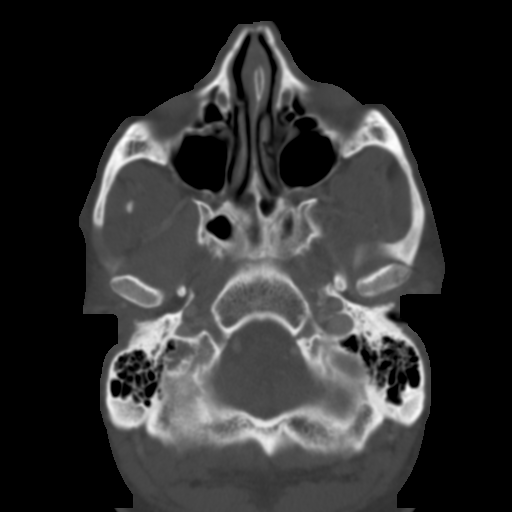
[im 63/72  bone]
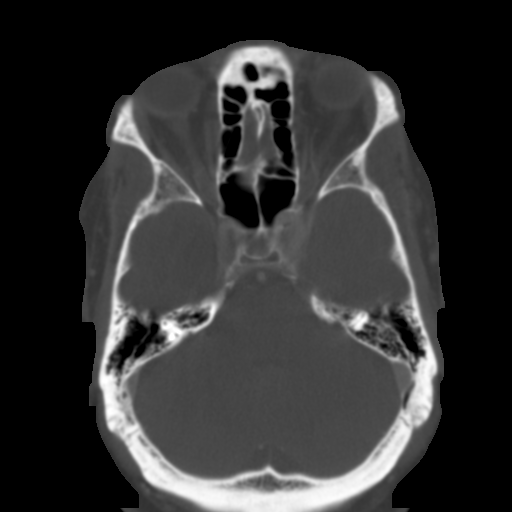

[10 of 14 positions shown; findings below may reference images not displayed]

CT OF SOFT TISSUES OF NECK-

CT was performed after administration of intravenous contrast.

There is a right-sided, supraglottic mass involving the base of the

tongue, uvula, adenoids, palatine tonsils and right aryepiglottic folds.

There is secondary marked narrowing of the oropharyngeal airway.  These

findings are nonspecific and may be secondary to neoplasm or diffuse

edema from allergy or infection.  There is no evidence of adenopathy.

IMPRESSION-

Diffuse swelling and mass, as described.

## 2012-02-14 IMAGING — CR RAD CHEST AP PORTABLE
1 series · 1 of 1 positions shown · non-contrast
Comparison: none

[AP]
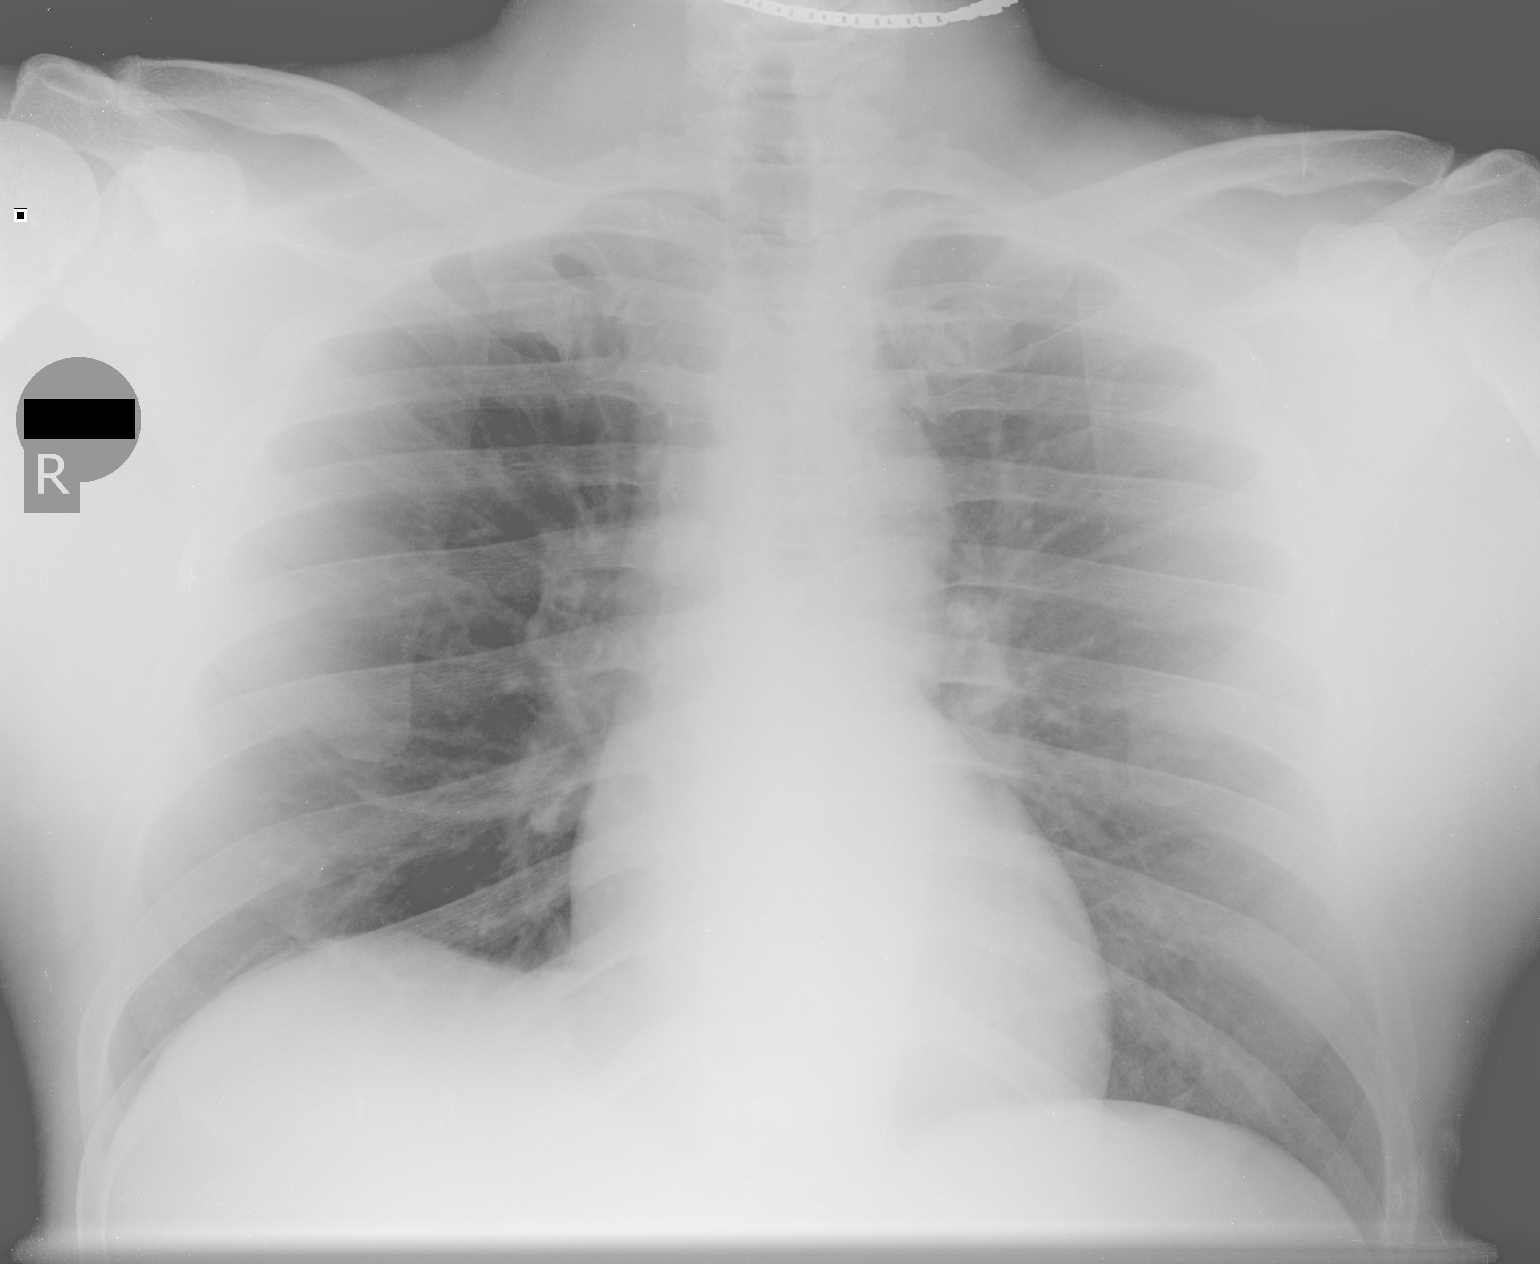

[1 of 1 positions shown; findings below may reference images not displayed]

CHEST-

Limited, portable, expiratory view demonstrates that the lungs are clear

of acute infiltrate or congestion.  The heart is not enlarged.

IMPRESSION-

No acute pulmonary disease.

## 2012-02-29 ENCOUNTER — Other Ambulatory Visit: Payer: Self-pay | Admitting: Primary Care

## 2012-03-14 NOTE — Telephone Encounter (Signed)
Please find out which inhalers he needs prior  auth for and we can call the pharmacy and switch him to generic if need be

## 2012-03-14 NOTE — Telephone Encounter (Signed)
Pt wife is calling inquiring about his inhaler per mrs Kinoshita the pharmacy stated that he will need a prior auth please fua

## 2012-03-15 ENCOUNTER — Ambulatory Visit
Admit: 2012-03-15 | Discharge: 2012-03-15 | Disposition: A | Discharge status: Home / Self Care | Payer: Self-pay | Source: Ambulatory Visit | Attending: Primary Care | Admitting: Primary Care

## 2012-03-15 DIAGNOSIS — E559 Vitamin D deficiency, unspecified: Secondary | ICD-10-CM

## 2012-03-15 MED ORDER — BUDESONIDE 180 MCG/ACT IN AEPB *A*
1.0000 | INHALATION_SPRAY | Freq: Two times a day (BID) | RESPIRATORY_TRACT | Status: DC
Start: 2012-03-15 — End: 2013-07-21

## 2012-03-15 NOTE — Telephone Encounter (Signed)
Patient prescription switched to Pulmicort

## 2012-03-15 NOTE — Telephone Encounter (Signed)
MVP tier one budesonide  Tier  Two- asmanex, dulera, pulmicort, Qvar, and Symbicort

## 2012-03-15 NOTE — Addendum Note (Signed)
Addended by: Leitha Bleak I on: 03/15/2012 05:26 PM     Modules accepted: Orders

## 2012-03-15 NOTE — Telephone Encounter (Addendum)
Called pharmacy.  The flovent needs prior auth. Nothing really equivalent in generic.  Closest would be advair, but it isn't really the same.  Please advise

## 2012-03-19 ENCOUNTER — Telehealth: Payer: Self-pay | Admitting: Primary Care

## 2012-03-19 LAB — VITAMIN D
25-OH VIT D2: 7 ng/mL
25-OH VIT D3: 15 ng/mL
25-OH Vit Total: 22 ng/mL — ABNORMAL LOW (ref 30–80)

## 2012-03-19 NOTE — Telephone Encounter (Signed)
Message copied by Reece Agar on Tue Mar 19, 2012 12:12 PM  ------       Message from: Leitha Bleak I       Created: Tue Mar 19, 2012 12:03 PM         The patient is vitamin D deficient and needs to continue vitamin D 50,000 units once a weekin the next 3 months  ------

## 2012-03-19 NOTE — Telephone Encounter (Signed)
msg left for pt to call office.

## 2012-03-21 NOTE — Telephone Encounter (Signed)
Pt.notified

## 2012-03-28 ENCOUNTER — Ambulatory Visit: Payer: Self-pay | Admitting: Primary Care

## 2012-03-28 ENCOUNTER — Encounter: Payer: Self-pay | Admitting: Primary Care

## 2012-03-28 VITALS — BP 112/70 | HR 72 | Ht 61.42 in | Wt 195.0 lb

## 2012-03-28 DIAGNOSIS — J309 Allergic rhinitis, unspecified: Secondary | ICD-10-CM

## 2012-03-28 DIAGNOSIS — E7800 Pure hypercholesterolemia, unspecified: Secondary | ICD-10-CM

## 2012-03-28 DIAGNOSIS — E559 Vitamin D deficiency, unspecified: Secondary | ICD-10-CM

## 2012-03-28 DIAGNOSIS — Z1211 Encounter for screening for malignant neoplasm of colon: Secondary | ICD-10-CM

## 2012-03-28 NOTE — Progress Notes (Signed)
NOTE  Subjective:      Patient ID: Carl Olson is a 51 y.o. year old male.    Chief Complaint   Patient presents with   . Follow-up     4 month       HPI:51 year old male here for followup for hyperlipidemia.  He continues on Zocor which he tolerates well with no muscle aches and nausea.  In addition patient has not been using his Pulmicort inhaler frequently but only uses nasal steroid and Allegra.  No increased nasal congestion or wheezing        Medication reviewed and updated.    Allergy /Medications/ Problem List/ Social History / :  Allergies   Allergen Reactions   . Moxifloxacin      Created by Conversion - 0;      Current Outpatient Prescriptions on File Prior to Visit   Medication Sig Dispense Refill   . budesonide (PULMICORT) 180 MCG/ACT flexhaler Inhale 1 puff into the lungs 2 times daily  1 Inhaler  6   . NASONEX 50 MCG/ACT nasal spray USE 2 SPRAYS IN EACH NOSTRIL ONCE DAILY  17 g  4   . simvastatin (ZOCOR) 20 MG tablet TAKE 1 TABLET BY MOUTH EVERY DAY  30 tablet  5   . aspirin 81 MG tablet TAKE 1 TABLET DAILY.    0   . Calcium Carbonate-Vitamin D (CALTRATE 600+D) 600-400 MG-UNIT per tablet TAKE 1 TABLET TWICE DAILY  60  5   . fexofenadine (ALLEGRA) 180 MG tablet TAKE 1 TABLET DAILY.  30  5     No current facility-administered medications on file prior to visit.     Patient Active Problem List   Diagnosis Code   . Allergic Rhinitis 477.9   . Asthma 493.90   . Esophageal Reflux 530.81   . Acute Bronchitis 466.0   . Myalgia And Myositis 729.1   . Joint Pain, Localized In The Shoulder 719.41   . Chest Pain 786.50   . Otitis Externa 380.10   . Dermatitis 692.9   . 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 Asthmatic Bronchitis 493.90   . Neck Injury 959.09   . Acute Chest Wall Trauma 959.1   . Vitamin D Deficiency 268.9     History   Substance Use Topics   . Smoking status: Never Smoker    . Smokeless tobacco: Never Used   . Alcohol Use: Not on file         Objective:       Physical Exam:    Filed Vitals:     03/28/12 1421   BP: 112/70   Pulse: 72   Height: 1.56 m (5' 1.42")   Weight: 88.451 kg (195 lb)      Estimated body mass index is 36.35 kg/(m^2) as calculated from the following:    Height as of this encounter: 1.56 m (5' 1.42").    Weight as of this encounter: 88.451 kg (195 lb).   SpO2 Readings from Last 3 Encounters:   No data found for SpO2       General:  Alert, cooperative, nontoxic, and in NAD.obese male  Head:  Atraumatic, normocephelic, symmetrical without deformities.   Eyes: No conjunctival injection, ptosis, or sclerus icterus.   Ears:  External ear exam without abnormalities. Hearing intact  Neck:   No cervical lymphadenopathy. Supple. Nontender.no carotid bruits  Lungs:  CTA without wheezes, rales, or rhonchi.    Heart:  S1 and S2 are noted. RRR. No murmurs,  rubs, or gallops.    Ext:  No clubbing, cyanosis, edema   Psych:  Good eye contact, appropriate affect, good historian     Recent Lab Results:        Component Value Date/Time    NA 141 11/29/2011 0825    K 4.4 11/29/2011 0825    CL 104 11/29/2011 0825    CO2 27 11/29/2011 0825    UN 15 11/29/2011 0825    CREAT 1.32* 11/29/2011 0825    GFRC 63 11/29/2011 0825    GFRB 72 11/29/2011 0825    GLU 96 11/29/2011 0825    CA 9.5 11/29/2011 0825    VID25 22* 03/15/2012 0838    TSH 1.66 06/07/2011 0814    CHOL 191 11/29/2011 0825    TRIG 100 11/29/2011 0825    HDL 55 11/29/2011 0825    LDLC 116 11/29/2011 0825          Component Value Date/Time    WBC 8.3 06/07/2011 0814    HGB 14.3 06/07/2011 0814    HCT 44 06/07/2011 0814    PLT 255 06/07/2011 0814          Assessment / Plan:       1hypercholesterolemia.  Patient will continue a low-cholesterol diet and his current dose of simvastatin 20 mg daily.  2 vitamin D deficiency.  Patient continues to be slightly vitamin D deficient and needs to take over-the-counter vitamin D replacement.  3 history of allergic rhinitis with no recent increase in symptoms.patient is to continue his current allergy medications.  Patient  referred for screening colonoscopy    Immunization History   Administered Date(s) Administered   . Influenza Split(6yr&up) 06/05/2011   . Influenza Whole 07/08/2007, 05/04/2008, 05/06/2009   . Tdap 03/02/2008            Orders Placed This Encounter   Procedures   . CBC and differential   . Comprehensive metabolic panel   . Lipid panel   . Vitamin d 25 hydroxy, d2&d3   . AMB REFERRAL TO GASTROENTEROLOGY     Patient's Medications   New Prescriptions    No medications on file   Previous Medications    ASPIRIN 81 MG TABLET    TAKE 1 TABLET DAILY.    BUDESONIDE (PULMICORT) 180 MCG/ACT FLEXHALER    Inhale 1 puff into the lungs 2 times daily    CALCIUM CARBONATE-VITAMIN D (CALTRATE 600+D) 600-400 MG-UNIT PER TABLET    TAKE 1 TABLET TWICE DAILY    FEXOFENADINE (ALLEGRA) 180 MG TABLET    TAKE 1 TABLET DAILY.    NASONEX 50 MCG/ACT NASAL SPRAY    USE 2 SPRAYS IN EACH NOSTRIL ONCE DAILY    SIMVASTATIN (ZOCOR) 20 MG TABLET    TAKE 1 TABLET BY MOUTH EVERY DAY   Modified Medications    No medications on file   Discontinued Medications    ERGOCALCIFEROL (DRISDOL) 50000 UNIT CAPSULE    Take 1 capsule (50,000 Units total) by mouth once a week    OMEPRAZOLE (PRILOSEC) 20 MG CAPSULE    TAKE 1 CAPSULE DAILY       Signed: Leitha Bleak, MD on 03/28/2012 at 2:52 PM

## 2012-06-17 ENCOUNTER — Other Ambulatory Visit: Payer: Self-pay | Admitting: Primary Care

## 2012-06-17 NOTE — Telephone Encounter (Signed)
Last seen 03/28/12    Future 06/28/12    Please Advise

## 2012-06-28 ENCOUNTER — Ambulatory Visit: Payer: Self-pay | Admitting: Primary Care

## 2012-06-28 ENCOUNTER — Encounter: Payer: Self-pay | Admitting: Primary Care

## 2012-06-28 VITALS — BP 130/90 | HR 82 | Ht 61.42 in | Wt 196.4 lb

## 2012-06-28 DIAGNOSIS — Z23 Encounter for immunization: Secondary | ICD-10-CM

## 2012-06-28 DIAGNOSIS — E559 Vitamin D deficiency, unspecified: Secondary | ICD-10-CM

## 2012-06-28 DIAGNOSIS — J309 Allergic rhinitis, unspecified: Secondary | ICD-10-CM

## 2012-06-28 DIAGNOSIS — J45909 Unspecified asthma, uncomplicated: Secondary | ICD-10-CM

## 2012-06-28 DIAGNOSIS — E7800 Pure hypercholesterolemia, unspecified: Secondary | ICD-10-CM

## 2012-06-28 NOTE — Progress Notes (Signed)
NOTE  Subjective:      Patient ID: Carl Olson is a 51 y.o. year old male.    Chief Complaint   Patient presents with   . Follow-up       HPI:51 year old male with a history of bronchial asthma hypercholesterolemia allergic rhinitis and vitamin D deficiency who is here for followup.  Patient has been using his noon pain and states he has a cough at times.  She denies any increased wheezing or postnasal drip.  No muscle aches or nausea with taking his statin.  Patient has been consistently taking his vitamin D.        Medication reviewed and updated.    Allergy /Medications/ Problem List/ Social History / :  Allergies   Allergen Reactions   . Moxifloxacin      Created by Conversion - 0;      Current Outpatient Prescriptions on File Prior to Visit   Medication Sig Dispense Refill   . simvastatin (ZOCOR) 20 MG tablet TAKE 1 TABLET BY MOUTH EVERY DAY  30 tablet  0   . budesonide (PULMICORT) 180 MCG/ACT flexhaler Inhale 1 puff into the lungs 2 times daily  1 Inhaler  6   . NASONEX 50 MCG/ACT nasal spray USE 2 SPRAYS IN EACH NOSTRIL ONCE DAILY  17 g  4   . aspirin 81 MG tablet TAKE 1 TABLET DAILY.    0   . Calcium Carbonate-Vitamin D (CALTRATE 600+D) 600-400 MG-UNIT per tablet TAKE 1 TABLET TWICE DAILY  60  5   . fexofenadine (ALLEGRA) 180 MG tablet TAKE 1 TABLET DAILY.  30  5     No current facility-administered medications on file prior to visit.     Patient Active Problem List   Diagnosis Code   . Allergic Rhinitis 477.9   . Asthma 493.90   . Esophageal Reflux 530.81   . Acute Bronchitis 466.0   . Myalgia And Myositis 729.1   . Joint Pain, Localized In The Shoulder 719.41   . Chest Pain 786.50   . Otitis Externa 380.10   . Dermatitis 692.9   . 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 Asthmatic Bronchitis 493.90   . Neck Injury 959.09   . Acute Chest Wall Trauma 959.1   . Vitamin D Deficiency 268.9     History   Substance Use Topics   . Smoking status: Never Smoker    . Smokeless tobacco: Never Used   . Alcohol Use:  Not on file         Objective:       Physical Exam:    Filed Vitals:    06/28/12 1504   BP: 130/90   Pulse: 82   Height: 1.56 m (5' 1.42")   Weight: 89.086 kg (196 lb 6.4 oz)      Estimated body mass index is 36.61 kg/(m^2) as calculated from the following:    Height as of this encounter: 1.56 m (5' 1.42").    Weight as of this encounter: 89.086 kg (196 lb 6.4 oz).   SpO2 Readings from Last 3 Encounters:   No data found for SpO2       General:  Alert, cooperative, nontoxic, and in NAD.obese male  Head:  Atraumatic, normocephelic, symmetrical without deformities.   Eyes: No conjunctival injection, ptosis, or sclerus icterus.   Nasal mucosa pale  Neck:   No cervical lymphadenopathy. Supple. Nontender.  Lungs:  CTA without wheezes, rales, or rhonchi.    Heart:  S1 and  S2 are noted. RRR. No murmurs, rubs, or gallops.    Ext:  No clubbing, cyanosis, edema   Psych:  Good eye contact, appropriate affect, good historian     Recent Lab Results:        Component Value Date/Time    NA 141 11/29/2011 0825    K 4.4 11/29/2011 0825    CL 104 11/29/2011 0825    CO2 27 11/29/2011 0825    UN 15 11/29/2011 0825    CREAT 1.32* 11/29/2011 0825    GFRC 63 11/29/2011 0825    GFRB 72 11/29/2011 0825    GLU 96 11/29/2011 0825    CA 9.5 11/29/2011 0825    VID25 22* 03/15/2012 0838    TSH 1.66 06/07/2011 0814    CHOL 191 11/29/2011 0825    TRIG 100 11/29/2011 0825    HDL 55 11/29/2011 0825    LDLC 116 11/29/2011 0825          Component Value Date/Time    WBC 8.3 06/07/2011 0814    HGB 14.3 06/07/2011 0814    HCT 44 06/07/2011 0814    PLT 255 06/07/2011 0814          Assessment / Plan:   1bronchial asthma in this patient with a history of allergic rhinitis spirometry was done and showed that his FEV1 was 95%.  Patient to continue his current inhalers.  Hyperlipidemia:  According to ATP III Guidelines LDL above goal. Based on risk profile and co-morbidities LDL goal is 100.  HDL at goal  Based on risk profile and co-morbidities HDL goal is 40  Triglyceride at  goal Based on risk profile and co-morbidities Triglyceride goal is 150.    Plan to reach goal includes:  Lifestyle modifications: weight reduction, discussed low cholesterol and saturated fat diet and discussed aerobic physical activity  Medication Management: no changes made and discussed low fat diet  Follow up in 4 months    3allergic rhinitis.  Patient to continue his allergy medications.  4 vitamin D deficiency.  Patient to obtain a vitamin D level.  He'll return for followup in 4 months.    Patient given a flu shot today  Immunization History   Administered Date(s) Administered   . Influenza Adult(88yr and up) 06/05/2011   . Influenza Injectable Quadrivalent PF 06/28/2012   . Influenza Whole 07/08/2007, 05/04/2008, 05/06/2009   . Tdap 03/02/2008            Orders Placed This Encounter   Procedures   . Flu vaccine Quadrivalent greater than or equal to 3yo preservative free IM (AMBULATORY USE ONLY)   . Spirometry (In Office)     Patient's Medications   New Prescriptions    No medications on file   Previous Medications    ASPIRIN 81 MG TABLET    TAKE 1 TABLET DAILY.    BUDESONIDE (PULMICORT) 180 MCG/ACT FLEXHALER    Inhale 1 puff into the lungs 2 times daily    CALCIUM CARBONATE-VITAMIN D (CALTRATE 600+D) 600-400 MG-UNIT PER TABLET    TAKE 1 TABLET TWICE DAILY    FEXOFENADINE (ALLEGRA) 180 MG TABLET    TAKE 1 TABLET DAILY.    NASONEX 50 MCG/ACT NASAL SPRAY    USE 2 SPRAYS IN EACH NOSTRIL ONCE DAILY    SIMVASTATIN (ZOCOR) 20 MG TABLET    TAKE 1 TABLET BY MOUTH EVERY DAY   Modified Medications    No medications on file   Discontinued Medications    No  medications on file       Signed: Leitha Bleak, MD on 06/28/2012 at 5:49 PM

## 2012-06-29 ENCOUNTER — Ambulatory Visit
Admit: 2012-06-29 | Discharge: 2012-06-29 | Disposition: A | Discharge status: Home / Self Care | Payer: Self-pay | Source: Ambulatory Visit | Attending: Primary Care | Admitting: Primary Care

## 2012-06-29 LAB — CBC AND DIFFERENTIAL
Baso # K/uL: 0 10*3/uL (ref 0.0–0.1)
Basophil %: 0.3 % (ref 0.2–1.2)
Eos # K/uL: 0.1 10*3/uL (ref 0.0–0.5)
Eosinophil %: 0.6 % — ABNORMAL LOW (ref 0.8–7.0)
Hematocrit: 46 % (ref 40–51)
Hemoglobin: 15.2 g/dL (ref 13.7–17.5)
Lymph # K/uL: 2 10*3/uL (ref 1.3–3.6)
Lymphocyte %: 18.2 % — ABNORMAL LOW (ref 21.8–53.1)
MCV: 87 fL (ref 79–92)
Mono # K/uL: 0.9 10*3/uL — ABNORMAL HIGH (ref 0.3–0.8)
Monocyte %: 7.8 % (ref 5.3–12.2)
Neut # K/uL: 8 10*3/uL — ABNORMAL HIGH (ref 1.8–5.4)
Platelets: 233 10*3/uL (ref 150–330)
RBC: 5.4 MIL/uL (ref 4.6–6.1)
RDW: 13.8 % (ref 11.6–14.4)
Seg Neut %: 73.1 % — ABNORMAL HIGH (ref 34.0–67.9)
WBC: 10.9 10*3/uL — ABNORMAL HIGH (ref 4.2–9.1)

## 2012-06-29 LAB — COMPREHENSIVE METABOLIC PANEL
ALT: 39 U/L (ref 0–50)
AST: 24 U/L (ref 0–50)
Albumin: 4.8 g/dL (ref 3.5–5.2)
Alk Phos: 61 U/L (ref 40–130)
Anion Gap: 12 (ref 7–16)
Bilirubin,Total: 0.8 mg/dL (ref 0.0–1.2)
CO2: 28 mmol/L (ref 20–28)
Calcium: 9.6 mg/dL (ref 8.6–10.2)
Chloride: 100 mmol/L (ref 96–108)
Creatinine: 1.24 mg/dL — ABNORMAL HIGH (ref 0.67–1.17)
GFR,Black: 78 *
GFR,Caucasian: 67 *
Glucose: 89 mg/dL (ref 60–99)
Lab: 11 mg/dL (ref 6–20)
Potassium: 4.3 mmol/L (ref 3.3–5.1)
Sodium: 140 mmol/L (ref 133–145)
Total Protein: 7 g/dL (ref 6.3–7.7)

## 2012-06-29 LAB — LIPID PANEL
Chol/HDL Ratio: 3.4
Cholesterol: 193 mg/dL
HDL: 57 mg/dL
LDL Calculated: 110 mg/dL
Non HDL Cholesterol: 136 mg/dL
Triglycerides: 129 mg/dL

## 2012-07-02 LAB — VITAMIN D
25-OH VIT D2: 8 ng/mL
25-OH VIT D3: 20 ng/mL
25-OH Vit Total: 28 ng/mL — ABNORMAL LOW (ref 30–60)

## 2012-07-16 ENCOUNTER — Other Ambulatory Visit: Payer: Self-pay | Admitting: Primary Care

## 2012-07-16 NOTE — Telephone Encounter (Signed)
Last seen 06/28/2012

## 2012-08-26 ENCOUNTER — Other Ambulatory Visit: Payer: Self-pay | Admitting: Primary Care

## 2012-08-26 NOTE — Telephone Encounter (Signed)
Last office visit 06/28/12.

## 2012-09-20 ENCOUNTER — Ambulatory Visit: Payer: Self-pay | Admitting: Primary Care

## 2012-09-20 ENCOUNTER — Encounter: Payer: Self-pay | Admitting: Primary Care

## 2012-09-20 VITALS — BP 130/80 | HR 80 | Temp 99.8°F | Ht 61.42 in | Wt 201.0 lb

## 2012-09-20 DIAGNOSIS — J329 Chronic sinusitis, unspecified: Secondary | ICD-10-CM | POA: Insufficient documentation

## 2012-09-20 MED ORDER — SALINE NASAL SPRAY 0.65 % NA SOLN *WRAPPED*
1.0000 | NASAL | Status: DC | PRN
Start: 2012-09-20 — End: 2012-11-01

## 2012-09-20 MED ORDER — AZITHROMYCIN 250 MG PO TABS *I*
ORAL_TABLET | ORAL | Status: AC
Start: 2012-09-20 — End: 2012-09-25

## 2012-09-20 MED ORDER — PSEUDOEPHEDRINE HCL 30 MG PO TABS *I*
30.0000 mg | ORAL_TABLET | Freq: Four times a day (QID) | ORAL | Status: DC | PRN
Start: 2012-09-20 — End: 2012-11-01

## 2012-09-20 NOTE — Progress Notes (Signed)
Subjective: Carl Olson is a 52 y.o. male who is seen for the first time by this provider on behalf of PCP Dr. Leitha Bleak, MD for an acute visit for complaint of respiratory illness for one week.  Began with a slight cough then developed nasal congestion and a headache.  Had myalgia that resolved. Progressively feeling worse with cough with yellow sputum and nasal congestion with severe pain on forehead and between eyes.  Fatigue increasing.  Working through it as an Visual merchandiser.  Now running a low fever.  Using robitussin CF.      Medications and history reviewed with patient in erecord today. Revisions, if any, noted below.     PE:    Filed Vitals:    09/20/12 0906   BP: 130/80   Pulse: 80   Temp: 37.7 C (99.8 F)   Height: 1.56 m (5' 1.42")   Weight: 91.173 kg (201 lb)     Body mass index is 37.46 kg/(m^2).    General:  Patient in no apparent distress.  Pleasant and cooperative.   HEENT:  Atraumatic.  PERRLA. EOMI. Oropharynx mucosa pink, moist and free of lesions, purulent post nasal drip visualized.  TM bilaterally visualized with good light reflex.  Frontal sinuses painful to percussion.  Thyroid without mass or enlargement.  Neck supple.  Nodes cervical and supraclavicular wnl.   Lung:  Clear to auscultation bilaterally. No adventitial sounds.  No dullness to percussion. No use of accessory muscles to breath.  CV:  Regular rate and rhythm, no murmur, gallop or rub.  No LE edema.     Assessment/Plan:    1. Sinusitis  pseudoephedrine (SUDAFED) 30 MG tablet, azithromycin (ZITHROMAX) 250 MG tablet, sodium chloride (OCEAN) 0.65 % nasal spray     See additional patient instructions.     Return if symptoms worsen or fail to improve.    New Prescriptions    AZITHROMYCIN (ZITHROMAX) 250 MG TABLET    Take 2 tablets (500 mg) on day 1, followed by 1 tablet (250 mg) on days 2 through 5.    PSEUDOEPHEDRINE (SUDAFED) 30 MG TABLET    Take 1 tablet (30 mg total) by mouth every 6 hours as needed for Congestion    SODIUM  CHLORIDE (OCEAN) 0.65 % NASAL SPRAY    1 spray by Each Nare route as needed for Congestion     Modified Medications    No medications on file     Discontinued Medications    No medications on file      Current Outpatient Prescriptions on File Prior to Visit   Medication Sig Dispense Refill   . simvastatin (ZOCOR) 20 MG tablet TAKE 1 TABLET BY MOUTH EVERY DAY  30 tablet  0   . budesonide (PULMICORT) 180 MCG/ACT flexhaler Inhale 1 puff into the lungs 2 times daily  1 Inhaler  6   . NASONEX 50 MCG/ACT nasal spray USE 2 SPRAYS IN EACH NOSTRIL ONCE DAILY  17 g  4   . aspirin 81 MG tablet TAKE 1 TABLET DAILY.    0   . Calcium Carbonate-Vitamin D (CALTRATE 600+D) 600-400 MG-UNIT per tablet TAKE 1 TABLET TWICE DAILY  60  5   . fexofenadine (ALLEGRA) 180 MG tablet TAKE 1 TABLET DAILY.  30  5     No current facility-administered medications on file prior to visit.

## 2012-09-20 NOTE — Patient Instructions (Signed)
Get rest.  Stay warm.  Drink hot liquids including chicken soup.  Steam yourself as we discussed. Grandma was right.

## 2012-09-27 ENCOUNTER — Other Ambulatory Visit: Payer: Self-pay | Admitting: Primary Care

## 2012-09-27 NOTE — Telephone Encounter (Signed)
Last office visit 09-20-12

## 2012-10-14 ENCOUNTER — Other Ambulatory Visit: Payer: Self-pay | Admitting: Primary Care

## 2012-10-14 MED ORDER — SIMVASTATIN 20 MG PO TABS *I*
20.0000 mg | ORAL_TABLET | Freq: Every day | ORAL | Status: DC
Start: 2012-10-14 — End: 2012-11-12

## 2012-10-14 NOTE — Telephone Encounter (Signed)
Last seen 09/20/12 with Dr, Everlena Cooper    Please Advise.

## 2012-11-01 ENCOUNTER — Encounter: Payer: Self-pay | Admitting: Primary Care

## 2012-11-01 ENCOUNTER — Ambulatory Visit: Payer: Self-pay | Admitting: Primary Care

## 2012-11-01 VITALS — BP 110/60 | HR 88 | Ht 61.0 in | Wt 204.8 lb

## 2012-11-01 DIAGNOSIS — J45909 Unspecified asthma, uncomplicated: Secondary | ICD-10-CM

## 2012-11-01 DIAGNOSIS — E669 Obesity, unspecified: Secondary | ICD-10-CM

## 2012-11-01 DIAGNOSIS — E7800 Pure hypercholesterolemia, unspecified: Secondary | ICD-10-CM

## 2012-11-01 DIAGNOSIS — Z139 Encounter for screening, unspecified: Secondary | ICD-10-CM

## 2012-11-01 DIAGNOSIS — E559 Vitamin D deficiency, unspecified: Secondary | ICD-10-CM

## 2012-11-01 NOTE — Progress Notes (Signed)
NOTE  Subjective:      Patient ID: Carl Olson is a 52 y.o. year old male.    Chief Complaint   Patient presents with   . Follow-up       HPI:52 year old male with a history of hypercholesterolemia allergic rhinitis and bronchial asthma who comes in for followup.  She states good compliance with his medications but has been unable to lose any weight.  He denies any increased shortness of breath or wheezing and uses his inhalers regularly        Medication reviewed and updated.    Allergy /Medications/ Problem List/ Social History / :  Allergies   Allergen Reactions   . Moxifloxacin      Created by Conversion - 0;        Patient Active Problem List   Diagnosis Code   . Allergic Rhinitis 477.9   . Asthma 493.90   . Esophageal Reflux 530.81   . Acute Bronchitis 466.0   . Myalgia And Myositis 729.1   . Joint Pain, Localized In The Shoulder 719.41   . Chest Pain 786.50   . Otitis Externa 380.10   . Dermatitis 692.9   . 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 Asthmatic Bronchitis 493.90   . Neck Injury 959.09   . Acute Chest Wall Trauma 959.1   . Vitamin D Deficiency 268.9   . Sinusitis 473.9   . Bronchial asthma 493.90     History   Substance Use Topics   . Smoking status: Never Smoker    . Smokeless tobacco: Never Used   . Alcohol Use: Not on file         Objective:       Physical Exam:    Filed Vitals:    11/01/12 1508   BP: 110/60   Pulse: 88   Height: 1.549 m (5\' 1" )   Weight: 92.897 kg (204 lb 12.8 oz)      Estimated body mass index is 38.72 kg/(m^2) as calculated from the following:    Height as of this encounter: 1.549 m (5\' 1" ).    Weight as of this encounter: 92.897 kg (204 lb 12.8 oz).   SpO2 Readings from Last 3 Encounters:   No data found for SpO2       General:  Alert, cooperative, nontoxic, and in NAD.obese male  Head:  Atraumatic, normocephelic, symmetrical without deformities.   Eyes: No conjunctival injection, ptosis, or sclerus icterus.   Neck:   No cervical lymphadenopathy. No carotid bruits no  jugular venous distention  Lungs:  CTA without wheezes, rales, or rhonchi.    Heart:  S1 and S2 are noted. RRR. No murmurs, rubs, or gallops.    Ext:  No clubbing, cyanosis, edema   Abdomen soft with no epigastric tenderness  Psych:  Good eye contact, appropriate affect, good historian     Recent Lab Results:        Component Value Date/Time    NA 140 06/29/2012 0850    K 4.3 06/29/2012 0850    CL 100 06/29/2012 0850    CO2 28 06/29/2012 0850    UN 11 06/29/2012 0850    CREAT 1.24* 06/29/2012 0850    GFRC 67 06/29/2012 0850    GFRB 78 06/29/2012 0850    GLU 89 06/29/2012 0850    CA 9.6 06/29/2012 0850    VID25 28* 06/29/2012 0850    TSH 1.66 06/07/2011 0814    CHOL 193 06/29/2012 0850    TRIG  129 06/29/2012 0850    HDL 57 06/29/2012 0850    LDLC 110 06/29/2012 0850          Component Value Date/Time    WBC 10.9* 06/29/2012 0850    HGB 15.2 06/29/2012 0850    HCT 46 06/29/2012 0850    PLT 233 06/29/2012 0850          Assessment / Plan:       ICD-9-CM   1. 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 Bronchial asthma 493.90   3. Obesity 278.00   4. Vitamin D deficiency 268.9   5. Screening V82.9     Hyperlipidemia:  According to ATP III Guidelines LDL at goal. Based on risk profile and co-morbidities LDL goal is 130.  HDL at goal  Based on risk profile and co-morbidities HDL goal is 40  Triglyceride at goal Based on risk profile and co-morbidities Triglyceride goal is 150.    Plan to reach goal includes:  Lifestyle modifications: weight reduction and discussed aerobic physical activity  Medication Management: no changes made and discussed low fat diet  Follow up in in 4 months.    2Bronchial asthma stable with no wheezing or shortness of breath.    3 obesity.  Patient has gained weight and is counseled about weight loss diet and exercise.  4 history of mild vitamin D deficiency.  Patient to get a repeat vitamin D level.    Hepatitis C screening was also ordered.  Followup in 4 months.        Immunization History    Administered Date(s) Administered   . Influenza Adult(92yr and up) 06/05/2011   . Influenza Injectable Quadrivalent PF 06/28/2012   . Influenza Whole 07/08/2007, 05/04/2008, 05/06/2009   . Tdap 03/02/2008            No orders of the defined types were placed in this encounter.     Patient's Medications   New Prescriptions    No medications on file   Previous Medications    ASPIRIN 81 MG TABLET    TAKE 1 TABLET DAILY.    BUDESONIDE (PULMICORT) 180 MCG/ACT FLEXHALER    Inhale 1 puff into the lungs 2 times daily    CALCIUM CARBONATE-VITAMIN D (CALTRATE 600+D) 600-400 MG-UNIT PER TABLET    TAKE 1 TABLET TWICE DAILY    FEXOFENADINE (ALLEGRA) 180 MG TABLET    TAKE 1 TABLET DAILY.    NASONEX 50 MCG/ACT NASAL SPRAY    USE 2 SPRAYS IN EACH NOSTRIL DAILY    SIMVASTATIN (ZOCOR) 20 MG TABLET    Take 1 tablet (20 mg total) by mouth daily   Modified Medications    No medications on file   Discontinued Medications    PSEUDOEPHEDRINE (SUDAFED) 30 MG TABLET    Take 1 tablet (30 mg total) by mouth every 6 hours as needed for Congestion    SODIUM CHLORIDE (OCEAN) 0.65 % NASAL SPRAY    1 spray by Each Nare route as needed for Congestion       Signed: Leitha Bleak, MD on 11/01/2012 at 3:29 PM

## 2012-11-09 ENCOUNTER — Ambulatory Visit
Admit: 2012-11-09 | Discharge: 2012-11-09 | Disposition: A | Discharge status: Home / Self Care | Payer: Self-pay | Source: Ambulatory Visit | Attending: Primary Care | Admitting: Primary Care

## 2012-11-09 DIAGNOSIS — E559 Vitamin D deficiency, unspecified: Secondary | ICD-10-CM

## 2012-11-09 DIAGNOSIS — E7800 Pure hypercholesterolemia, unspecified: Secondary | ICD-10-CM

## 2012-11-09 DIAGNOSIS — J45909 Unspecified asthma, uncomplicated: Secondary | ICD-10-CM

## 2012-11-09 DIAGNOSIS — Z139 Encounter for screening, unspecified: Secondary | ICD-10-CM

## 2012-11-09 LAB — COMPREHENSIVE METABOLIC PANEL
ALT: 38 U/L (ref 0–50)
AST: 23 U/L (ref 0–50)
Albumin: 4.8 g/dL (ref 3.5–5.2)
Alk Phos: 55 U/L (ref 40–130)
Anion Gap: 9 (ref 7–16)
Bilirubin,Total: 0.6 mg/dL (ref 0.0–1.2)
CO2: 30 mmol/L — ABNORMAL HIGH (ref 20–28)
Calcium: 9.7 mg/dL (ref 8.6–10.2)
Chloride: 103 mmol/L (ref 96–108)
Creatinine: 1.23 mg/dL — ABNORMAL HIGH (ref 0.67–1.17)
GFR,Black: 78 *
GFR,Caucasian: 68 *
Glucose: 93 mg/dL (ref 60–99)
Lab: 14 mg/dL (ref 6–20)
Potassium: 4.3 mmol/L (ref 3.3–5.1)
Sodium: 142 mmol/L (ref 133–145)
Total Protein: 6.9 g/dL (ref 6.3–7.7)

## 2012-11-09 LAB — CBC AND DIFFERENTIAL
Baso # K/uL: 0 10*3/uL (ref 0.0–0.1)
Basophil %: 0.3 % (ref 0.2–1.2)
Eos # K/uL: 0.1 10*3/uL (ref 0.0–0.5)
Eosinophil %: 0.6 % — ABNORMAL LOW (ref 0.8–7.0)
Hematocrit: 45 % (ref 40–51)
Hemoglobin: 14.9 g/dL (ref 13.7–17.5)
Lymph # K/uL: 2.2 10*3/uL (ref 1.3–3.6)
Lymphocyte %: 27.2 % (ref 21.8–53.1)
MCV: 86 fL (ref 79–92)
Mono # K/uL: 0.5 10*3/uL (ref 0.3–0.8)
Monocyte %: 6.1 % (ref 5.3–12.2)
Neut # K/uL: 5.2 10*3/uL (ref 1.8–5.4)
Platelets: 260 10*3/uL (ref 150–330)
RBC: 5.3 MIL/uL (ref 4.6–6.1)
RDW: 13.6 % (ref 11.6–14.4)
Seg Neut %: 65.8 % (ref 34.0–67.9)
WBC: 8 10*3/uL (ref 4.2–9.1)

## 2012-11-09 LAB — LIPID PANEL
Chol/HDL Ratio: 3.4
Cholesterol: 200 mg/dL — AB
HDL: 58 mg/dL
LDL Calculated: 126 mg/dL
Non HDL Cholesterol: 142 mg/dL
Triglycerides: 80 mg/dL

## 2012-11-10 LAB — HEPATITIS C ANTIBODY: Hep C Ab: NEGATIVE

## 2012-11-12 ENCOUNTER — Telehealth: Payer: Self-pay | Admitting: Primary Care

## 2012-11-12 LAB — VITAMIN D
25-OH VIT D2: <4 ng/mL
25-OH VIT D3: 22 ng/mL
25-OH Vit Total: 22 ng/mL — ABNORMAL LOW (ref 30–60)

## 2012-11-12 MED ORDER — SIMVASTATIN 40 MG PO TABS *I*
40.0000 mg | ORAL_TABLET | Freq: Every day | ORAL | Status: DC
Start: 2012-11-12 — End: 2013-08-20

## 2012-11-12 NOTE — Telephone Encounter (Signed)
Patient's LDL cholesterol is increasedwith his history of coronary artery disease.  His LDL needs to be 70  I've sent over to new prescription for simvastatin 40 mg daily.please call patient

## 2012-11-13 ENCOUNTER — Other Ambulatory Visit: Payer: Self-pay | Admitting: Primary Care

## 2012-11-13 MED ORDER — MOMETASONE FUROATE 50 MCG/ACT NA SUSP *I*
2.0000 | Freq: Every day | NASAL | Status: DC
Start: 2012-11-13 — End: 2013-01-11

## 2012-11-13 NOTE — Telephone Encounter (Signed)
Last seen 11/01/12

## 2012-11-13 NOTE — Telephone Encounter (Signed)
The patient has been informed.

## 2013-01-11 ENCOUNTER — Other Ambulatory Visit: Payer: Self-pay | Admitting: Primary Care

## 2013-01-14 ENCOUNTER — Encounter: Payer: Self-pay | Admitting: Gastroenterology

## 2013-01-14 ENCOUNTER — Ambulatory Visit
Admit: 2013-01-14 | Discharge: 2013-01-14 | Disposition: A | Discharge status: Home / Self Care | Payer: Self-pay | Attending: Gastroenterology | Admitting: Gastroenterology

## 2013-01-14 LAB — HM DIABETES EDUCATION

## 2013-01-14 LAB — HM COLONOSCOPY

## 2013-01-14 MED ORDER — FENTANYL CITRATE 50 MCG/ML IJ SOLN *WRAPPED*
INTRAMUSCULAR | Status: AC
Start: 2013-01-14 — End: 2013-01-14
  Filled 2013-01-14: qty 4

## 2013-01-14 MED ORDER — FENTANYL CITRATE 50 MCG/ML IJ SOLN *WRAPPED*
INTRAMUSCULAR | Status: AC | PRN
Start: 2013-01-14 — End: 2013-01-14
  Administered 2013-01-14: 50 ug via INTRAVENOUS
  Administered 2013-01-14 (×2): 25 ug via INTRAVENOUS

## 2013-01-14 MED ORDER — MIDAZOLAM HCL 1 MG/ML IJ SOLN *I* WRAPPED
INTRAMUSCULAR | Status: AC | PRN
Start: 2013-01-14 — End: 2013-01-14
  Administered 2013-01-14: 2 mg via INTRAVENOUS
  Administered 2013-01-14: 1 mg via INTRAVENOUS
  Administered 2013-01-14: 2 mg via INTRAVENOUS

## 2013-01-14 MED ORDER — MIDAZOLAM HCL 5 MG/5ML IJ SOLN *I*
INTRAMUSCULAR | Status: AC
Start: 2013-01-14 — End: 2013-01-14
  Filled 2013-01-14: qty 10

## 2013-01-14 MED ORDER — SODIUM CHLORIDE 0.9 % IV SOLN WRAPPED *I*
100.0000 mL/h | Status: DC
Start: 2013-01-14 — End: 2013-01-14
  Administered 2013-01-14: 100 mL/h via INTRAVENOUS

## 2013-01-14 NOTE — Procedures (Signed)
Procedure Report    Colonoscopy Procedure Note   Date of Procedure: 01/14/2013   Referring Physician: Leitha Bleak, MD  Primary Physician: Leitha Bleak, MD        Attending Physician:Deyonte Cadden Janora Norlander, MD  Fellow:   None  Indications:  Colorectal cancer screening-average risk    Medications: Fentanyl 100 mcg IV and Midazolam 5 mg IV were administered incrementally over the course of the procedure to achieve an adequate level of conscious sedation.      Procedure Details: Full disclosure of risks were reviewed with patient as detailed on the consent form. The patient was placed in the left lateral decubitus position and monitored with continuous pulse oximetry, interval blood pressure monitoring and direct observations.   After anorectal examination was performed, the colonoscope was inserted into the rectum and advanced under direct vision to the cecum, which was identified by  the ileocecal valve and the appendiceal orifice. The procedure was considered not difficult.    During withdrawal examination, the final quality of the prep was  good  A careful inspection was made as the colonoscope was withdrawn. A retroflexed view of the rectum was performed; findings and interventions are described below.The patient was recovered in the GI recovery area.     [id number]  Insertion Time: 0732  Cecum Time: 0736  Exit Time: 0741    Findings:   Anorectal:  Normal tone, no masses  Terminal Ileum:  Not evaluated  Cecum:   Small polyp in the cecum, removed with biopsy forceps  Ascending Colon:  Normal mucosa  Transverse Colon:   Normal mucosa  Descending Colon:   Normal mucosa  Sigmoid Colon:   Normal mucosa  Rectum:   Normal rectum throughout    Interventions:   As above.    Complications: none    Impression:  1. Small polyp in the cecum, removed with biopsy forceps    Recommendations:    Repeat colonoscopy in 10 years.  Histopathologic Diagnosis:pending    Tiffiny Worthy Janora Norlander, MD

## 2013-01-14 NOTE — Preop H&P (Signed)
OUTPATIENT PRE-PROCEDURE H&P    Chief Complaint / Indications for Procedure:screening    Past Medical History:     No past medical history on file.  No past surgical history on file.  No family history on file.  History     Social History   . Marital Status: Married     Spouse Name: N/A     Number of Children: N/A   . Years of Education: N/A     Social History Main Topics   . Smoking status: Never Smoker    . Smokeless tobacco: Never Used   . Alcohol Use: None   . Drug Use: None   . Sexually Active: None     Other Topics Concern   . None     Social History Narrative   . None       Allergies:    Allergies   Allergen Reactions   . Moxifloxacin      Created by Conversion - 0;        Medications:  Current Outpatient Prescriptions   Medication   . NASONEX 50 MCG/ACT nasal spray   . simvastatin (ZOCOR) 40 MG tablet   . budesonide (PULMICORT) 180 MCG/ACT flexhaler   . aspirin 81 MG tablet   . Calcium Carbonate-Vitamin D (CALTRATE 600+D) 600-400 MG-UNIT per tablet   . fexofenadine (ALLEGRA) 180 MG tablet     No current facility-administered medications for this encounter.      There were no vitals filed for this visit.    ROS:  [id number]    Physical Examination:  Head/Nose/Throat:negative  Lungs:Lungs clear  Cardiovascular:normal S1 and S2  Abdomen: abdomen soft, non-tender, nondistended, normal active bowel sounds, no masses or organomegaly      Lab Results: none    Radiology impressions (last 30 days):  No results found.    Currently Active Problems:  Patient Active Problem List   Diagnosis Code   . Allergic Rhinitis 477.9   . Asthma 493.90   . Esophageal Reflux 530.81   . Acute Bronchitis 466.0   . Myalgia And Myositis 729.1   . Joint Pain, Localized In The Shoulder 719.41   . Chest Pain 786.50   . Otitis Externa 380.10   . Dermatitis 692.9   . 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 Asthmatic Bronchitis 493.90   . Neck Injury 959.09   . Acute Chest Wall Trauma 959.1   . Vitamin D Deficiency 268.9   . Sinusitis 473.9    . Bronchial asthma 493.90          UPDATES TO PATIENT'S CONDITION on the DAY OF SURGERY/PROCEDURE    I. Updates to Patient's Condition (to be completed by a provider privileged to complete a H&P, following reassessment of the patient by the provider):    Full H&P done today; no updates needed.    II. Procedure Readiness   I have reviewed the patient's H&P and updated condition. By completing and signing this form, I attest that this patient is ready for surgery/procedure.      III. Attestation   I have reviewed the updated information regarding the patient's condition and it is appropriate to proceed with the planned surgery/procedure.    Ruey Storer Janora Norlander, MD as of 6:07 AM 01/14/2013

## 2013-01-14 NOTE — Discharge Instructions (Signed)
Gastroenterology Unit  Discharge Instructions for Colonoscopy      01/14/2013    7:54 AM    Colonoscopy and Polyp(s) Removed    Do not drive, operate heavy machinery, drink alcoholic beverages, make important personal or business decisions, or sign legal documents until the next day.      Return to your usual diet   Return to taking your usual medications    Things you may expect:   A small amount of bright red blood in your stool   It may be a few days before you have a bowel movement   You may have cramping, bloating, and feelings of "gas". These feelings should go away as you pass gas. If you still feel uncomfortable, walking around will help to pass the gas.   You were given medication to help you relax during the test. You may feel "fuzzy" and drowsy. Go home and rest for at least 4-6 hours.    You should call your doctor for any of the following:   Bad stomach pain   Fever   Bright red bleeding or clots (This may happen up to 20 days after the test.)   Dizziness or weakness that gets worse or lasts up to 24 hours.   Pain or redness at the IV site    If you have a serious problem after hours, Call [phone] to reach the GI physician on call. If you are unable to reach your doctor, go to the [facility] Emergency Department.    Follow Up Care:   Repeat colonoscopy after 10 years    New Prescriptions    No medications on file

## 2013-01-15 LAB — SURGICAL PATHOLOGY

## 2013-01-17 ENCOUNTER — Encounter: Payer: Self-pay | Admitting: Primary Care

## 2013-01-30 ENCOUNTER — Encounter: Payer: Self-pay | Admitting: Gastroenterology

## 2013-01-30 NOTE — Progress Notes (Signed)
I talked with the patient and notified of the results of colonoscopy with biopsies. 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 I advised the patient to have repeat colonoscopy after 5 years rather than 10 years.

## 2013-03-07 ENCOUNTER — Encounter: Payer: Self-pay | Admitting: Primary Care

## 2013-03-07 ENCOUNTER — Ambulatory Visit: Payer: Self-pay | Admitting: Primary Care

## 2013-03-07 VITALS — BP 122/84 | HR 104 | Temp 97.9°F | Ht 61.0 in | Wt 197.2 lb

## 2013-03-07 NOTE — Progress Notes (Signed)
NOTE  Subjective:      Patient ID: Carl Olson is a 52 y.o. year old male.    Chief Complaint   Patient presents with   . Asthma   . Obesity       HPI:52 year old male with a history of bronchial asthma hypercholesterolemia and obesity who is here for followup.  Patient states he has had less problems with allergies nasal congestion postnasal drip or asthma symptoms recently but does use his inhalers frequently uses a mask at work and tries to get into the air-conditioning.  He takes Zocor and has not had any muscle aches and is also on vitamin D.  Patient has been out of work for the last one week on a workers comp injury        Medication reviewed and updated.    Allergy /Medications/ Problem List/ Social History / :  Allergies   Allergen Reactions   . Moxifloxacin      Created by Conversion - 0;      Current Outpatient Prescriptions on File Prior to Visit   Medication Sig Dispense Refill   . NASONEX 50 MCG/ACT nasal spray SPRAY TWICE IN EACH NOSTRIL EVERY DAY  17 g  4   . simvastatin (ZOCOR) 40 MG tablet Take 1 tablet (40 mg total) by mouth daily (with dinner)  30 tablet  5   . budesonide (PULMICORT) 180 MCG/ACT flexhaler Inhale 1 puff into the lungs 2 times daily  1 Inhaler  6   . aspirin 81 MG tablet TAKE 1 TABLET DAILY.    0   . Calcium Carbonate-Vitamin D (CALTRATE 600+D) 600-400 MG-UNIT per tablet TAKE 1 TABLET TWICE DAILY  60  5   . fexofenadine (ALLEGRA) 180 MG tablet TAKE 1 TABLET DAILY.  30  5     No current facility-administered medications on file prior to visit.     Patient Active Problem List   Diagnosis Code   . Allergic Rhinitis 477.9   . Asthma 493.90   . Esophageal Reflux 530.81   . Acute Bronchitis 466.0   . Myalgia And Myositis 729.1   . Joint Pain, Localized In The Shoulder 719.41   . Chest Pain 786.50   . Otitis Externa 380.10   . Dermatitis 692.9   . 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 Asthmatic Bronchitis 493.90   . Neck Injury 959.09   . Acute Chest Wall Trauma 959.1   . Vitamin D  Deficiency 268.9   . Sinusitis 473.9   . Bronchial asthma 493.90     History   Substance Use Topics   . Smoking status: Never Smoker    . Smokeless tobacco: Never Used   . Alcohol Use: 1.8 oz/week     3 Cans of beer per week         Objective:       Physical Exam:    Filed Vitals:    03/07/13 1507   BP: 122/84   Pulse: 104   Temp: 36.6 C (97.9 F)   Height: 1.549 m (5\' 1" )   Weight: 89.449 kg (197 lb 3.2 oz)      Estimated body mass index is 37.28 kg/(m^2) as calculated from the following:    Height as of this encounter: 1.549 m (5\' 1" ).    Weight as of this encounter: 89.449 kg (197 lb 3.2 oz).   SpO2 Readings from Last 3 Encounters:   03/07/13 98%   01/14/13 97%  General:  Alert, cooperative, nontoxic, and in NAD.obese male  Head:  Atraumatic, normocephelic, symmetrical without deformities.   Eyes: No conjunctival injection, ptosis, or sclerus icterus.   Ears:  External ear exam without abnormalities.   Neck:   No cervical lymphadenopathy. Supple. Nontender.no carotid bruits  Lungs:  CTA without wheezes, rales, or rhonchi.    Heart:  S1 and S2 are noted. RRR. No murmurs, rubs, or gallops.    Ext:  No clubbing, cyanosis, edema   Psych:  Good eye contact, appropriate affect, good historian     Recent Lab Results:        Component Value Date/Time    NA 142 11/09/2012 0906    K 4.3 11/09/2012 0906    CL 103 11/09/2012 0906    CO2 30* 11/09/2012 0906    UN 14 11/09/2012 0906    CREAT 1.23* 11/09/2012 0906    GFRC 68 11/09/2012 0906    GFRB 78 11/09/2012 0906    GLU 93 11/09/2012 0906    CA 9.7 11/09/2012 0906    VID25 22* 11/09/2012 0906    TSH 1.66 06/07/2011 0814    CHOL 200* 11/09/2012 0906    TRIG 80 11/09/2012 0906    HDL 58 11/09/2012 0906    LDLC 126 11/09/2012 0906          Component Value Date/Time    WBC 8.0 11/09/2012 0906    HGB 14.9 11/09/2012 0906    HCT 45 11/09/2012 0906    PLT 260 11/09/2012 0906          Assessment / Plan:       ICD-9-CM   1. Asthma 493.90   2. 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 Vitamin D  Deficiency 268.9     Bronchial asthma and allergies.  Patient doing well on his current inhalers and allergy medications no change in medications needed.  2 hypercholesterolemia lipid numbers show an LDL of 126 and triglycerides of 80 HDL of 58 in  Marked.  Patient to continue his statin and re check his lipids in 3 months   3vitamin D deficiency.  Patient to continue vitamin D. Replacement.    Followup in 3 months.  Patient to obtain blood test before his next appointment.  Immunization History   Administered Date(s) Administered   . Influenza Adult(76yr and up) 06/05/2011   . Influenza Injectable Quadrivalent PF 06/28/2012   . Influenza Whole 07/08/2007, 05/04/2008, 05/06/2009   . Tdap 03/02/2008            No orders of the defined types were placed in this encounter.     Patient's Medications   New Prescriptions    No medications on file   Previous Medications    ASPIRIN 81 MG TABLET    TAKE 1 TABLET DAILY.    BUDESONIDE (PULMICORT) 180 MCG/ACT FLEXHALER    Inhale 1 puff into the lungs 2 times daily    CALCIUM CARBONATE-VITAMIN D (CALTRATE 600+D) 600-400 MG-UNIT PER TABLET    TAKE 1 TABLET TWICE DAILY    FEXOFENADINE (ALLEGRA) 180 MG TABLET    TAKE 1 TABLET DAILY.    NASONEX 50 MCG/ACT NASAL SPRAY    SPRAY TWICE IN EACH NOSTRIL EVERY DAY    SIMVASTATIN (ZOCOR) 40 MG TABLET    Take 1 tablet (40 mg total) by mouth daily (with dinner)   Modified Medications    No medications on file   Discontinued Medications    No medications on file  Signed: Leitha Bleak, MD on 03/07/2013 at 3:29 PM

## 2013-04-18 ENCOUNTER — Encounter: Payer: Self-pay | Admitting: Gastroenterology

## 2013-05-28 ENCOUNTER — Encounter: Payer: Self-pay | Admitting: Primary Care

## 2013-05-28 ENCOUNTER — Ambulatory Visit: Payer: Self-pay | Admitting: Primary Care

## 2013-05-28 VITALS — BP 122/86 | HR 88 | Temp 99.0°F | Ht 61.0 in | Wt 201.4 lb

## 2013-05-28 DIAGNOSIS — J45909 Unspecified asthma, uncomplicated: Secondary | ICD-10-CM

## 2013-05-28 MED ORDER — ALBUTEROL SULFATE HFA 108 (90 BASE) MCG/ACT IN AERS *I*
1.0000 | INHALATION_SPRAY | Freq: Four times a day (QID) | RESPIRATORY_TRACT | Status: DC | PRN
Start: 2013-05-28 — End: 2014-11-10

## 2013-05-28 NOTE — Progress Notes (Signed)
Subjective: Carl Olson is a 52 y.o. male pmhx asthma, HLD, GERD, allergic rhinitis most significant in this context who is seen for the first time by this provider on behalf of PCP Dr. Leitha Bleak, MD for an acute visit for complaint of fever and cough x ii days.    Cough initially yellow phlegm now clear. Yesterday had chills.  Sinus pressure, burning between his eyes, purulent nasal discharge.  Yesterday patient's wife was treated for similar symptoms with with additional nausea. She is beginning to feel better.  She was treated with nasal steroids. He has a history of having received antibiotics with these symptoms.     Taking [facility] maximum strength mucus relief cold and sinus max (apap, guaifenesin, phenylephrine) with benefit.     Medications and history reviewed with patient in erecord today. Revisions, if any, noted below.     PE:    Filed Vitals:    05/28/13 1301   BP: 122/86   Pulse: 88   Temp: 37.2 C (99 F)   Height: 1.549 m (5\' 1" )   Weight: 91.354 kg (201 lb 6.4 oz)     Body mass index is 38.07 kg/(m^2).    General:  Patient in no apparent distress.  Pleasant and cooperative.   HEENT:  Atraumatic.  PERRLA. EOMI. Oropharynx mucosa pink, moist and free of lesions.  TM bilaterally visualized with good light reflex.  Thyroid without mass or enlargement.  Neck supple.  Nodes cervical and supraclavicular wnl. Sinuses tender.   CV:  Regular rate and rhythm, no murmur, gallop or rub.  No LE edema.   Lung:  Mild wheeze with forced expiration. No rales. No dullness to percussion. No use of accessory muscles to breath.    Assessment/Plan:    1. Bronchial asthma  albuterol (PROVENTIL, VENTOLIN, PROAIR HFA) 108 (90 BASE) MCG/ACT inhaler  Amoxicillin 500 mg i tab po tid.       Return if symptoms worsen or fail to improve.    New Prescriptions    ALBUTEROL (PROVENTIL, VENTOLIN, PROAIR HFA) 108 (90 BASE) MCG/ACT INHALER    Inhale 1-2 puffs into the lungs every 6 hours as needed for Wheezing   Shake well  before each use.    AMOXICILLIN (AMOXIL) 500 MG CAPSULE    Take 1 capsule (500 mg total) by mouth 3 times daily     Modified Medications    No medications on file     Discontinued Medications    No medications on file      Current Outpatient Prescriptions on File Prior to Visit   Medication Sig Dispense Refill   . NASONEX 50 MCG/ACT nasal spray SPRAY TWICE IN EACH NOSTRIL EVERY DAY  17 g  4   . simvastatin (ZOCOR) 40 MG tablet Take 1 tablet (40 mg total) by mouth daily (with dinner)  30 tablet  5   . budesonide (PULMICORT) 180 MCG/ACT flexhaler Inhale 1 puff into the lungs 2 times daily  1 Inhaler  6   . aspirin 81 MG tablet TAKE 1 TABLET DAILY.    0   . Calcium Carbonate-Vitamin D (CALTRATE 600+D) 600-400 MG-UNIT per tablet TAKE 1 TABLET TWICE DAILY  60  5   . fexofenadine (ALLEGRA) 180 MG tablet TAKE 1 TABLET DAILY.  30  5   . lidocaine (LIDODERM) 5 % patch          No current facility-administered medications on file prior to visit.

## 2013-05-30 ENCOUNTER — Encounter: Payer: Self-pay | Admitting: Gastroenterology

## 2013-06-10 ENCOUNTER — Ambulatory Visit
Admit: 2013-06-10 | Discharge: 2013-06-10 | Disposition: A | Discharge status: Home / Self Care | Payer: Self-pay | Source: Ambulatory Visit | Attending: Primary Care | Admitting: Primary Care

## 2013-06-10 DIAGNOSIS — E7800 Pure hypercholesterolemia, unspecified: Secondary | ICD-10-CM

## 2013-06-10 DIAGNOSIS — E559 Vitamin D deficiency, unspecified: Secondary | ICD-10-CM

## 2013-06-10 DIAGNOSIS — J45909 Unspecified asthma, uncomplicated: Secondary | ICD-10-CM

## 2013-06-10 LAB — COMPREHENSIVE METABOLIC PANEL
ALT: 38 U/L (ref 0–50)
AST: 29 U/L (ref 0–50)
Albumin: 4.6 g/dL (ref 3.5–5.2)
Alk Phos: 60 U/L (ref 40–130)
Anion Gap: 10 (ref 7–16)
Bilirubin,Total: 0.3 mg/dL (ref 0.0–1.2)
CO2: 29 mmol/L — ABNORMAL HIGH (ref 20–28)
Calcium: 9.8 mg/dL (ref 8.6–10.2)
Chloride: 101 mmol/L (ref 96–108)
Creatinine: 1.14 mg/dL (ref 0.67–1.17)
GFR,Black: 85 *
GFR,Caucasian: 74 *
Glucose: 93 mg/dL (ref 60–99)
Lab: 12 mg/dL (ref 6–20)
Potassium: 4.2 mmol/L (ref 3.3–5.1)
Sodium: 140 mmol/L (ref 133–145)
Total Protein: 6.7 g/dL (ref 6.3–7.7)

## 2013-06-10 LAB — LIPID PANEL
Chol/HDL Ratio: 3.2
Cholesterol: 159 mg/dL
HDL: 49 mg/dL
LDL Calculated: 88 mg/dL
Non HDL Cholesterol: 110 mg/dL
Triglycerides: 108 mg/dL

## 2013-06-10 LAB — TSH: TSH: 1.7 u[IU]/mL (ref 0.27–4.20)

## 2013-06-10 LAB — T4, FREE: Free T4: 1.4 ng/dL (ref 0.9–1.7)

## 2013-06-12 ENCOUNTER — Ambulatory Visit: Payer: Self-pay | Admitting: Primary Care

## 2013-06-12 ENCOUNTER — Encounter: Payer: Self-pay | Admitting: Primary Care

## 2013-06-12 ENCOUNTER — Telehealth: Payer: Self-pay | Admitting: Primary Care

## 2013-06-12 VITALS — BP 118/84 | HR 72 | Temp 99.0°F | Ht 61.0 in | Wt 203.0 lb

## 2013-06-12 DIAGNOSIS — J309 Allergic rhinitis, unspecified: Secondary | ICD-10-CM

## 2013-06-12 DIAGNOSIS — E559 Vitamin D deficiency, unspecified: Secondary | ICD-10-CM

## 2013-06-12 DIAGNOSIS — J45909 Unspecified asthma, uncomplicated: Secondary | ICD-10-CM

## 2013-06-12 DIAGNOSIS — E7800 Pure hypercholesterolemia, unspecified: Secondary | ICD-10-CM

## 2013-06-12 DIAGNOSIS — Z23 Encounter for immunization: Secondary | ICD-10-CM

## 2013-06-12 LAB — VITAMIN D
25-OH VIT D2: 5 ng/mL
25-OH VIT D3: 27 ng/mL
25-OH Vit Total: 32 ng/mL (ref 30–60)

## 2013-06-12 MED ORDER — AMOXICILLIN 500 MG PO CAPS *I*
500.0000 mg | ORAL_CAPSULE | Freq: Three times a day (TID) | ORAL | Status: DC
Start: 2013-06-12 — End: 2013-12-25

## 2013-06-12 NOTE — Progress Notes (Signed)
NOTE  Subjective:      Patient ID: Carl Olson is a 52 y.o. year old male.    Chief Complaint   Patient presents with   . Hyperlipidemia   . Asthma       HPI:52 year old male with a history of hyperlipidemia and bronchial asthma who is here for followup.  He was seen here 2 weeks ago with an upper respiratory infection and states that he has continued to have a productive cough no increased shortness of breath or wheezing.  He states good compliance with his simvastatin        Medication reviewed and updated.    Allergy /Medications/ Problem List/ Social History / :  Allergies   Allergen Reactions   . Moxifloxacin      Created by Conversion - 0;      Current Outpatient Prescriptions on File Prior to Visit   Medication Sig Dispense Refill   . NASONEX 50 MCG/ACT nasal spray SPRAY TWICE IN EACH NOSTRIL EVERY DAY  17 g  4   . simvastatin (ZOCOR) 40 MG tablet Take 1 tablet (40 mg total) by mouth daily (with dinner)  30 tablet  5   . budesonide (PULMICORT) 180 MCG/ACT flexhaler Inhale 1 puff into the lungs 2 times daily  1 Inhaler  6   . aspirin 81 MG tablet TAKE 1 TABLET DAILY.    0   . Calcium Carbonate-Vitamin D (CALTRATE 600+D) 600-400 MG-UNIT per tablet TAKE 1 TABLET TWICE DAILY  60  5   . fexofenadine (ALLEGRA) 180 MG tablet TAKE 1 TABLET DAILY.  30  5   . albuterol (PROVENTIL, VENTOLIN, PROAIR HFA) 108 (90 BASE) MCG/ACT inhaler Inhale 1-2 puffs into the lungs every 6 hours as needed for Wheezing   Shake well before each use.  1 Inhaler  0   . lidocaine (LIDODERM) 5 % patch          No current facility-administered medications on file prior to visit.     Patient Active Problem List   Diagnosis Code   . Allergic Rhinitis 477.9   . Asthma 493.90   . Esophageal Reflux 530.81   . Acute Bronchitis 466.0   . Myalgia And Myositis 729.1   . Joint Pain, Localized In The Shoulder 719.41   . Chest Pain 786.50   . Otitis Externa 380.10   . Dermatitis 692.9   . 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 Asthmatic Bronchitis  493.90   . Neck Injury 959.09   . Acute Chest Wall Trauma 959.1   . Vitamin D Deficiency 268.9   . Sinusitis 473.9   . Bronchial asthma 493.90     History   Substance Use Topics   . Smoking status: Never Smoker    . Smokeless tobacco: Never Used   . Alcohol Use: 1.8 oz/week     3 Cans of beer per week         Objective:       Physical Exam:    Filed Vitals:    06/12/13 1432   BP: 118/84   Pulse: 72   Temp: 37.2 C (99 F)   Height: 1.549 m (5\' 1" )   Weight: 92.08 kg (203 lb)      Estimated body mass index is 38.38 kg/(m^2) as calculated from the following:    Height as of this encounter: 1.549 m (5\' 1" ).    Weight as of this encounter: 92.08 kg (203 lb).   SpO2 Readings from Last 3  Encounters:   05/28/13 99%   03/07/13 98%   01/14/13 97%       General:  Alert, cooperative, nontoxic, and in NAD.  Head:  Atraumatic, normocephelic, symmetrical without deformities.   Eyes: No conjunctival injection, ptosis, or sclerus icterus.   Ears:  External ear exam without abnormalities. Hearing intact  Neck:   No cervical lymphadenopathy. Supple. Nontender.  Lungs:  No dullness to percussion no rales rhonchi or wheezes  Heart:  S1 and S2 are noted. RRR. No murmurs, rubs, or gallops.    Ext:  No clubbing, cyanosis, edema   Psych:  Good eye contact, appropriate affect, good historian     Recent Lab Results:        Component Value Date/Time    NA 140 06/10/2013 0807    K 4.2 06/10/2013 0807    CL 101 06/10/2013 0807    CO2 29* 06/10/2013 0807    UN 12 06/10/2013 0807    CREAT 1.14 06/10/2013 0807    GFRC 74 06/10/2013 0807    GFRB 85 06/10/2013 0807    GLU 93 06/10/2013 0807    CA 9.8 06/10/2013 0807    VID25 32 06/10/2013 0807    TSH 1.70 06/10/2013 0807    CHOL 159 06/10/2013 0807    TRIG 108 06/10/2013 0807    HDL 49 06/10/2013 0807    LDLC 88 06/10/2013 0807          Component Value Date/Time    WBC 8.0 11/09/2012 0906    HGB 14.9 11/09/2012 0906    HCT 45 11/09/2012 0906    PLT 260 11/09/2012 0906          Assessment / Plan:   1.  Bronchial asthma  Patient states that his asthma has been under relatively good control but that he's had a productive cough ever since he had an upper respiratory infection 2 weeks ago.  He was continue with his current inhalers but was given a prescription for amoxicillin to treat any associated bacterial bronchitis    2. Familial Hypercholesterolemia  Patient to continue simvastatin his lipid numbers are excellent    3. Vitamin D Deficiency  Patient continues on calcium and vitamin D .  Vitamin D level is 28    4. Allergic Rhinitis  Patient's allergy symptoms continue to be a problem in spite of taking his allergy medications    5. Immunization due    - Flu Vaccine Quadrivalent IM (greater than or equal to 104mo with minimal preserv)      Immunization History   Administered Date(s) Administered   . Influenza Adult(42yr and up) 06/05/2011   . Influenza Injectable Quadrivalent PF 06/28/2012   . Influenza Injectable Quadrivalent with preservati 06/12/2013   . Influenza Whole 07/08/2007, 05/04/2008, 05/06/2009   . Tdap 03/02/2008            Orders Placed This Encounter   Procedures   . Flu Vaccine Quadrivalent IM (greater than or equal to 104mo with minimal preserv)     Patient's Medications   New Prescriptions    AMOXICILLIN (AMOXIL) 500 MG CAPSULE    Take 1 capsule (500 mg total) by mouth 3 times daily   Previous Medications    ALBUTEROL (PROVENTIL, VENTOLIN, PROAIR HFA) 108 (90 BASE) MCG/ACT INHALER    Inhale 1-2 puffs into the lungs every 6 hours as needed for Wheezing   Shake well before each use.    ASPIRIN 81 MG TABLET    TAKE 1  TABLET DAILY.    BUDESONIDE (PULMICORT) 180 MCG/ACT FLEXHALER    Inhale 1 puff into the lungs 2 times daily    CALCIUM CARBONATE-VITAMIN D (CALTRATE 600+D) 600-400 MG-UNIT PER TABLET    TAKE 1 TABLET TWICE DAILY    FEXOFENADINE (ALLEGRA) 180 MG TABLET    TAKE 1 TABLET DAILY.    LIDOCAINE (LIDODERM) 5 % PATCH        NASONEX 50 MCG/ACT NASAL SPRAY    SPRAY TWICE IN EACH NOSTRIL EVERY DAY     PIROXICAM (FELDENE) 20 MG CAPSULE        SIMVASTATIN (ZOCOR) 40 MG TABLET    Take 1 tablet (40 mg total) by mouth daily (with dinner)   Modified Medications    No medications on file   Discontinued Medications    No medications on file       Signed: Leitha Bleak, MD on 06/12/2013 at 7:01 PM

## 2013-07-21 ENCOUNTER — Other Ambulatory Visit: Payer: Self-pay | Admitting: Primary Care

## 2013-07-23 ENCOUNTER — Ambulatory Visit: Payer: Self-pay | Admitting: Primary Care

## 2013-07-23 ENCOUNTER — Encounter: Payer: Self-pay | Admitting: Primary Care

## 2013-07-23 VITALS — BP 130/80 | HR 112 | Ht 60.98 in | Wt 200.0 lb

## 2013-07-23 MED ORDER — PIROXICAM 20 MG PO CAPS *I*
20.0000 mg | ORAL_CAPSULE | Freq: Every day | ORAL | Status: DC
Start: 2013-07-23 — End: 2013-12-25

## 2013-07-23 NOTE — Progress Notes (Signed)
Subjective: Carl Olson is a 52 y.o. male who is seen for the first time by this provider on behalf of PCP Dr. Leitha Bleak, MD for an acute visit for complaint of right knee pain.  Painful for about two weeks.  Has tried nothing to treat it.  Worse with pressure on knee.  No trauma. No prior episodes. Knee does not lock or give out. No fever/chills.     Medications and history reviewed with patient in erecord today. Revisions, if any, noted below.     PE:    Filed Vitals:    07/23/13 1551   BP: 130/80   Pulse: 112   Height: 1.549 m (5' 0.98")   Weight: 90.719 kg (200 lb)     Body mass index is 37.81 kg/(m^2).    General:  Patient in no apparent distress.  Pleasant and cooperative.   Ext:  Right knee: Fluctuant swelling with mild warmth over right patella.  No redness.     Assessment/Plan:    1. Bursitis of right patella  piroxicam (FELDENE) 20 MG capsule    AMB REFERRAL TO ORTHOPEDIC SURGERY       Return if symptoms worsen or fail to improve.    New Prescriptions    No medications on file     Modified Medications    Modified Medication Previous Medication    PIROXICAM (FELDENE) 20 MG CAPSULE piroxicam (FELDENE) 20 MG capsule       Take 1 capsule (20 mg total) by mouth daily         Discontinued Medications    No medications on file      Current Outpatient Prescriptions on File Prior to Visit   Medication Sig Dispense Refill   . PULMICORT FLEXHALER 180 MCG/ACT flexhaler TAKE 1 PUFF BY MOUTH TWICE DAILY  1 g  0   . amoxicillin (AMOXIL) 500 MG capsule Take 1 capsule (500 mg total) by mouth 3 times daily  30 capsule  0   . albuterol (PROVENTIL, VENTOLIN, PROAIR HFA) 108 (90 BASE) MCG/ACT inhaler Inhale 1-2 puffs into the lungs every 6 hours as needed for Wheezing   Shake well before each use.  1 Inhaler  0   . lidocaine (LIDODERM) 5 % patch        . NASONEX 50 MCG/ACT nasal spray SPRAY TWICE IN EACH NOSTRIL EVERY DAY  17 g  4   . simvastatin (ZOCOR) 40 MG tablet Take 1 tablet (40 mg total) by mouth daily (with  dinner)  30 tablet  5   . aspirin 81 MG tablet TAKE 1 TABLET DAILY.    0   . Calcium Carbonate-Vitamin D (CALTRATE 600+D) 600-400 MG-UNIT per tablet TAKE 1 TABLET TWICE DAILY  60  5   . fexofenadine (ALLEGRA) 180 MG tablet TAKE 1 TABLET DAILY.  30  5     No current facility-administered medications on file prior to visit.

## 2013-07-30 ENCOUNTER — Other Ambulatory Visit: Payer: Self-pay | Admitting: Orthopedic Surgery

## 2013-07-30 ENCOUNTER — Ambulatory Visit: Payer: Self-pay | Admitting: Orthopedic Surgery

## 2013-07-30 ENCOUNTER — Encounter: Payer: Self-pay | Admitting: Orthopedic Surgery

## 2013-07-30 VITALS — BP 147/79 | HR 80 | Ht 61.0 in | Wt 198.0 lb

## 2013-07-30 DIAGNOSIS — M25569 Pain in unspecified knee: Secondary | ICD-10-CM

## 2013-07-30 NOTE — Progress Notes (Signed)
CHIEF COMPLAINT: Right knee swelling    HISTORY OF PRESENT ILLNESS: This patient is a 52 year old gentleman who presents to the office for the first time for evaluation of pain and swelling in the anterior aspect of his right knee.  He indicates that he does a fair amount of probably on his hands and knees both at work as well as at home.  He does not feel that this is a work-related problem.  He describe some tightness across the anterior aspect.  He has been placed on meloxicam.  This has been a problem for him for at least the last several months.  He does not describe any fevers or chills.    PAST MEDICAL HISTORY: Arthritis, reflux, asthma, back problems    PAST SURGICAL HISTORY: Shoulder surgery    MEDICATIONS:  See Medication List    ALLERGIES:  See Allergy List    SOCIAL HISTORY: He is married.  He does not smoke or use drugs.      FAMILY HISTORY: Denies she    REVIEW OF SYSTEMS:  Review of Gastointestinal, Genitourinary, Neurologic, Integument, Vascular, Hematologic, Lymphatic, Cardiac, Pulmonary and Endocrine systems reveal the following: Negative for pertinent positives    PHYSICAL EXAM: His right knee today shows a thickened fluid collection in the prepatellar bursa.  No surrounding erythema or other evidence of infection.  He has no effusion.  Range of motion is from 0 to 120.  There is really minimal tenderness to palpation over the bursal fluid collection.  No joint line tenderness.  No ligamentous instability.    IMAGING: I did obtain 4 views of his right knee and these were reviewed.  These do not show any obvious acute bony abnormality.  Soft tissue swelling seen anterior to the patella consistent with a prepatellar bursitis.    ASSESSMENT AND DIAGNOSIS: Prepatellar bursitis of the right knee    PLAN: Discussion was had with the patient and his wife regarding treatment options.  At this point his symptoms are consistent with a prepatellar bursitis.  The fluid collection does not feel watery, but  rather feels thickened making aspiration unlikely to be successful.  He will invest in some kneepads to minimize the pressure on his knee.  He was advised of the potential benefits of topical agents such as BenGay or Aspercreme.  He will continue with his anti-inflammatories orally.  Followup at this point will be an as-needed basis.    ORDERS TODAY: 4 views right knee    ORDERS NEXT VISIT:    PERCENT OF TEMPORARY IMPAIRMENT:

## 2013-08-20 ENCOUNTER — Other Ambulatory Visit: Payer: Self-pay | Admitting: Primary Care

## 2013-10-06 ENCOUNTER — Other Ambulatory Visit: Payer: Self-pay | Admitting: Primary Care

## 2013-10-06 MED ORDER — SIMVASTATIN 40 MG PO TABS *I*
40.0000 mg | ORAL_TABLET | Freq: Every day | ORAL | Status: DC
Start: 2013-10-06 — End: 2014-08-11

## 2013-10-06 NOTE — Telephone Encounter (Signed)
Last seen 07/23/13.

## 2013-11-26 ENCOUNTER — Other Ambulatory Visit: Payer: Self-pay | Admitting: Primary Care

## 2013-11-26 MED ORDER — BUDESONIDE 180 MCG/ACT IN AEPB *A*
1.0000 | INHALATION_SPRAY | Freq: Two times a day (BID) | RESPIRATORY_TRACT | Status: DC
Start: 2013-11-26 — End: 2015-04-06

## 2013-11-26 NOTE — Telephone Encounter (Signed)
Last seen 07/23/13.

## 2013-12-09 ENCOUNTER — Ambulatory Visit: Payer: Self-pay | Admitting: Primary Care

## 2013-12-25 ENCOUNTER — Ambulatory Visit: Payer: Self-pay | Admitting: Primary Care

## 2013-12-25 ENCOUNTER — Encounter: Payer: Self-pay | Admitting: Primary Care

## 2013-12-25 VITALS — BP 128/84 | HR 100 | Ht 61.0 in | Wt 205.0 lb

## 2013-12-25 DIAGNOSIS — E559 Vitamin D deficiency, unspecified: Secondary | ICD-10-CM

## 2013-12-25 DIAGNOSIS — J45909 Unspecified asthma, uncomplicated: Secondary | ICD-10-CM

## 2013-12-25 DIAGNOSIS — E7800 Pure hypercholesterolemia, unspecified: Secondary | ICD-10-CM

## 2013-12-25 NOTE — Progress Notes (Signed)
NOTE  Subjective:      Patient ID: Carl Olson is a 53 y.o. year old male.    Chief Complaint   Patient presents with    Follow-up       HPI:  53 year old male here for followup of bronchial asthma and hypercholesterolemia.  Patient has been compliant with his allergy medications and his inhalers and denies any recent increased shortness of breath chest pain or cough.      Medication reviewed and updated.    Allergy /Medications/ Problem List/ Social History / :  Allergies   Allergen Reactions    Moxifloxacin      Created by Conversion - 0;      Current Outpatient Prescriptions on File Prior to Visit   Medication Sig Dispense Refill    budesonide (PULMICORT FLEXHALER) 180 MCG/ACT flexhaler Inhale 1 puff into the lungs 2 times daily  1 g  5    simvastatin (ZOCOR) 40 MG tablet Take 1 tablet (40 mg total) by mouth daily (with dinner)  30 tablet  5    albuterol (PROVENTIL, VENTOLIN, PROAIR HFA) 108 (90 BASE) MCG/ACT inhaler Inhale 1-2 puffs into the lungs every 6 hours as needed for Wheezing   Shake well before each use.  1 Inhaler  0    NASONEX 50 MCG/ACT nasal spray SPRAY TWICE IN EACH NOSTRIL EVERY DAY  17 g  4    aspirin 81 MG tablet TAKE 1 TABLET DAILY.    0    Calcium Carbonate-Vitamin D (CALTRATE 600+D) 600-400 MG-UNIT per tablet TAKE 1 TABLET TWICE DAILY  60  5    fexofenadine (ALLEGRA) 180 MG tablet TAKE 1 TABLET DAILY.  30  5    lidocaine (LIDODERM) 5 % patch          No current facility-administered medications on file prior to visit.     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    Asthmatic Bronchitis 493.90    Neck Injury 959.09    Acute Chest Wall Trauma 959.1    Vitamin D Deficiency 268.9     Sinusitis 473.9    Bronchial asthma 493.90    Bursitis of right patella 726.60    Pain in joint, lower leg 719.46     History   Substance Use Topics    Smoking status: Never Smoker     Smokeless tobacco: Never Used    Alcohol Use: 1.8 oz/week     3 Cans of beer per week         Objective:       Physical Exam:    Filed Vitals:    12/25/13 1544   BP: 128/84   Pulse: 100   Height: 1.549 m (5\' 1" )   Weight: 92.987 kg (205 lb)      Estimated body mass index is 38.75 kg/(m^2) as calculated from the following:    Height as of this encounter: 1.549 m (5\' 1" ).    Weight as of this encounter: 92.987 kg (205 lb).   SpO2 Readings from Last 3 Encounters:   07/23/13 96%   05/28/13 99%  03/07/13 98%       General:  Alert, cooperative, nontoxic, and in NAD.Obese male  Head:  Atraumatic, normocephelic, symmetrical without deformities.   Eyes: No conjunctival injection, ptosis, or sclerus icterus. Extraocular movements full  Ears:  External ear exam without abnormalities. Hearing intact  Neck:   No cervical lymphadenopathy. No thyromegaly or jugular venous distention  Lungs:  CTA without wheezes, rales, or rhonchi.    Heart:  S1 and S2 are noted. RRR. No murmurs, rubs, or gallops.    Ext:  No clubbing, cyanosis, edema   Psych:  Good eye contact, appropriate affect, good historian     Recent Lab Results:        Component Value Date/Time    NA 140 06/10/2013 0807    K 4.2 06/10/2013 0807    CL 101 06/10/2013 0807    CO2 29* 06/10/2013 0807    UN 12 06/10/2013 0807    CREAT 1.14 06/10/2013 0807    GFRC 74 06/10/2013 0807    GFRB 85 06/10/2013 0807    GLU 93 06/10/2013 0807    CA 9.8 06/10/2013 0807    VID25 32 06/10/2013 0807    TSH 1.70 06/10/2013 0807    CHOL 159 06/10/2013 0807    TRIG 108 06/10/2013 0807    HDL 49 06/10/2013 0807    LDLC 88 06/10/2013 0807          Component Value Date/Time    WBC 8.0 11/09/2012 0906    HGB 14.9 11/09/2012 0906    HCT 45 11/09/2012 0906    PLT 260 11/09/2012 0906          Assessment / Plan:        ICD-9-CM ICD-10-CM   1. Bronchial asthma 493.90 J45.909   2. 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 Vitamin D deficiency 268.9 E55.9     Bronchial asthma.  Patient is not wheezing does not have a cough and is not short of breath.  He'll continue his current medications.  2 hypercholesterolemia last lab tests were done in October and were at goal.  Patient to continue his current medications and will check his lipid profile and LFTs.  3 history of vitamin D deficiency.  Last vitamin D level was 32 in October would recheck this.  Patient does not need any prescriptions at this time.  4 obesity.  Patient would benefit from weight loss and was counseled about losing weight.  Followup in 4 months.    Immunization History   Administered Date(s) Administered    Influenza Adult(47yr and up) 06/05/2011    Influenza Injectable Quadrivalent PF 06/28/2012    Influenza Injectable Quadrivalent with preservati 06/12/2013    Influenza Whole 07/08/2007, 05/04/2008, 05/06/2009    Tdap 03/02/2008            Orders Placed This Encounter   Procedures    CBC and differential    Comprehensive metabolic panel    Lipid panel    TSH    T4, free    Vitamin D     Patient's Medications   New Prescriptions    No medications on file   Previous Medications    ALBUTEROL (PROVENTIL, VENTOLIN, PROAIR HFA) 108 (90 BASE) MCG/ACT INHALER    Inhale 1-2 puffs into the lungs every 6 hours as needed for Wheezing   Shake well before each use.    ASPIRIN 81 MG TABLET    TAKE 1 TABLET DAILY.    BUDESONIDE (PULMICORT FLEXHALER) 180  MCG/ACT FLEXHALER    Inhale 1 puff into the lungs 2 times daily    CALCIUM CARBONATE-VITAMIN D (CALTRATE 600+D) 600-400 MG-UNIT PER TABLET    TAKE 1 TABLET TWICE DAILY    FEXOFENADINE (ALLEGRA) 180 MG TABLET    TAKE 1 TABLET DAILY.    LIDOCAINE (LIDODERM) 5 % PATCH        NASONEX 50 MCG/ACT NASAL SPRAY    SPRAY TWICE IN EACH NOSTRIL EVERY DAY    SIMVASTATIN (ZOCOR) 40 MG TABLET    Take 1 tablet (40 mg total) by  mouth daily (with dinner)   Modified Medications    No medications on file   Discontinued Medications    AMOXICILLIN (AMOXIL) 500 MG CAPSULE    Take 1 capsule (500 mg total) by mouth 3 times daily    PIROXICAM (FELDENE) 20 MG CAPSULE    Take 1 capsule (20 mg total) by mouth daily       Signed: Oneal Grout, MD on 12/25/2013 at 4:04 PM

## 2014-03-05 ENCOUNTER — Other Ambulatory Visit: Payer: Self-pay | Admitting: Primary Care

## 2014-03-06 NOTE — Telephone Encounter (Signed)
Last office visit 12-25-13

## 2014-03-30 ENCOUNTER — Ambulatory Visit
Admit: 2014-03-30 | Discharge: 2014-03-30 | Disposition: A | Discharge status: Home / Self Care | Payer: Self-pay | Source: Ambulatory Visit | Attending: Primary Care | Admitting: Primary Care

## 2014-03-30 DIAGNOSIS — J45909 Unspecified asthma, uncomplicated: Secondary | ICD-10-CM

## 2014-03-30 DIAGNOSIS — E559 Vitamin D deficiency, unspecified: Secondary | ICD-10-CM

## 2014-03-30 DIAGNOSIS — E7800 Pure hypercholesterolemia, unspecified: Secondary | ICD-10-CM

## 2014-03-30 LAB — CBC AND DIFFERENTIAL
Baso # K/uL: 0 10*3/uL (ref 0.0–0.1)
Basophil %: 0.6 %
Eos # K/uL: 0 10*3/uL (ref 0.0–0.5)
Eosinophil %: 0.6 %
Hematocrit: 46 % (ref 40–51)
Hemoglobin: 14.9 g/dL (ref 13.7–17.5)
IMM Granulocytes #: 0 % (ref 0.0–0.1)
IMM Granulocytes: 0.1 %
Lymph # K/uL: 1.9 10*3/uL (ref 1.3–3.6)
Lymphocyte %: 26.8 %
MCH: 29 pg/cell (ref 26–32)
MCHC: 32 g/dL (ref 32–37)
MCV: 89 fL (ref 79–92)
Mono # K/uL: 0.5 10*3/uL (ref 0.3–0.8)
Monocyte %: 6.8 %
Neut # K/uL: 4.7 10*3/uL (ref 1.8–5.4)
Platelets: 253 10*3/uL (ref 150–330)
RBC: 5.2 MIL/uL (ref 4.6–6.1)
RDW: 13.4 % (ref 11.6–14.4)
Seg Neut %: 65.1 %
WBC: 7.2 10*3/uL (ref 4.2–9.1)

## 2014-03-30 LAB — COMPREHENSIVE METABOLIC PANEL
ALT: 33 U/L (ref 0–50)
AST: 23 U/L (ref 0–50)
Albumin: 4.7 g/dL (ref 3.5–5.2)
Alk Phos: 55 U/L (ref 40–130)
Anion Gap: 11 (ref 7–16)
Bilirubin,Total: 0.5 mg/dL (ref 0.0–1.2)
CO2: 29 mmol/L — ABNORMAL HIGH (ref 20–28)
Calcium: 9.8 mg/dL (ref 8.6–10.2)
Chloride: 102 mmol/L (ref 96–108)
Creatinine: 1.21 mg/dL — ABNORMAL HIGH (ref 0.67–1.17)
GFR,Black: 79 *
GFR,Caucasian: 68 *
Glucose: 85 mg/dL (ref 60–99)
Lab: 10 mg/dL (ref 6–20)
Potassium: 4.5 mmol/L (ref 3.3–5.1)
Sodium: 142 mmol/L (ref 133–145)
Total Protein: 6.6 g/dL (ref 6.3–7.7)

## 2014-03-30 LAB — TSH: TSH: 1.57 u[IU]/mL (ref 0.27–4.20)

## 2014-03-30 LAB — LIPID PANEL
Chol/HDL Ratio: 3.8
Cholesterol: 205 mg/dL — AB
HDL: 54 mg/dL
LDL Calculated: 130 mg/dL
Non HDL Cholesterol: 151 mg/dL
Triglycerides: 107 mg/dL

## 2014-03-30 LAB — T4, FREE: Free T4: 1.6 ng/dL (ref 0.9–1.7)

## 2014-04-01 LAB — VITAMIN D
25-OH VIT D2: <4 ng/mL
25-OH VIT D3: 22 ng/mL
25-OH Vit Total: 22 ng/mL — ABNORMAL LOW (ref 30–60)

## 2014-04-30 ENCOUNTER — Encounter: Payer: Self-pay | Admitting: Primary Care

## 2014-04-30 ENCOUNTER — Ambulatory Visit: Payer: Self-pay | Admitting: Primary Care

## 2014-04-30 VITALS — BP 122/64 | HR 84 | Ht 61.0 in | Wt 203.0 lb

## 2014-04-30 DIAGNOSIS — E7800 Pure hypercholesterolemia, unspecified: Secondary | ICD-10-CM

## 2014-04-30 DIAGNOSIS — J45909 Unspecified asthma, uncomplicated: Secondary | ICD-10-CM

## 2014-04-30 NOTE — Progress Notes (Signed)
NOTE  Subjective:      Patient ID: Carl Olson is a 53 y.o. year old male.    Chief Complaint   Patient presents with    Hyperlipidemia     follow up       HPI:53 year old male with a history of hypercholesterolemiaand allergies to the environment with reactive bronchospasm who is here for followup.  He takes his Zocor regularly denies muscle aches no nausea has not had any problems with increased wheezing or shortness of breath as long as he uses his Pulmicort and his Nasonex        Medication reviewed and updated.    Allergy /Medications/ Problem List/ Social History / :  Allergies   Allergen Reactions    Moxifloxacin      Created by Conversion - 0;      Current Outpatient Prescriptions on File Prior to Visit   Medication Sig Dispense Refill    NASONEX 50 MCG/ACT nasal spray SPRAY TWICE IN EACH NOSTRIL EVERY DAY 17 g 2    budesonide (PULMICORT FLEXHALER) 180 MCG/ACT flexhaler Inhale 1 puff into the lungs 2 times daily 1 g 5    simvastatin (ZOCOR) 40 MG tablet Take 1 tablet (40 mg total) by mouth daily (with dinner) 30 tablet 5    albuterol (PROVENTIL, VENTOLIN, PROAIR HFA) 108 (90 BASE) MCG/ACT inhaler Inhale 1-2 puffs into the lungs every 6 hours as needed for Wheezing   Shake well before each use. 1 Inhaler 0    lidocaine (LIDODERM) 5 % patch       aspirin 81 MG tablet TAKE 1 TABLET DAILY.  0    Calcium Carbonate-Vitamin D (CALTRATE 600+D) 600-400 MG-UNIT per tablet TAKE 1 TABLET TWICE DAILY 60 5    fexofenadine (ALLEGRA) 180 MG tablet TAKE 1 TABLET DAILY. 30 5     No current facility-administered medications on file prior to visit.     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    Asthmatic Bronchitis 493.90     Neck Injury 959.09    Acute Chest Wall Trauma 959.1    Vitamin D Deficiency 268.9    Sinusitis 473.9    Bronchial asthma 493.90    Bursitis of right patella 726.60    Pain in joint, lower leg 719.46     History   Substance Use Topics    Smoking status: Never Smoker     Smokeless tobacco: Never Used    Alcohol Use: 1.8 oz/week     3 Cans of beer per week         Objective:       Physical Exam:    Filed Vitals:    04/30/14 1600   BP: 122/64   Pulse: 84   Height: 1.549 m (5\' 1" )   Weight: 92.08 kg (203 lb)      Estimated body mass index is 38.38 kg/(m^2) as calculated from the following:    Height as of this encounter: 1.549 m (5\' 1" ).    Weight as of this encounter: 92.08 kg (203 lb).   SpO2 Readings from Last 3 Encounters:   07/23/13 96%  05/28/13 99%   03/07/13 98%       General:  Alert, cooperative, nontoxic, and in NAD.obese male  Head:  Atraumatic, normocephelic, symmetrical without deformities.   Eyes: No conjunctival injection, ptosis, or sclerus icterus.   Ears:  External ear exam without abnormalities. Hearing intact  Neck:   No cervical lymphadenopathy. No carotid bruits or jugular venous distention and no thyromegaly.  Lungs:  CTA without wheezes, rales, or rhonchi.    Heart:  S1 and S2 are noted. RRR. No murmurs, rubs, or gallops.    Ext:  No clubbing, cyanosis, edema   Psych:  Good eye contact, appropriate affect, good historian     Recent Lab Results:        Component Value Date/Time    NA 142 03/30/2014 1040    K 4.5 03/30/2014 1040    CL 102 03/30/2014 1040    CO2 29* 03/30/2014 1040    UN 10 03/30/2014 1040    CREAT 1.21* 03/30/2014 1040    GFRC 68 03/30/2014 1040    GFRB 79 03/30/2014 1040    GLU 85 03/30/2014 1040    CA 9.8 03/30/2014 1040    VID25 22* 03/30/2014 1040    TSH 1.57 03/30/2014 1040    CHOL 205* 03/30/2014 1040    TRIG 107 03/30/2014 1040    HDL 54 03/30/2014 1040    LDLC 130 03/30/2014 1040          Component Value Date/Time    WBC 7.2 03/30/2014 1040    HGB 14.9  03/30/2014 1040    HCT 46 03/30/2014 1040    PLT 253 03/30/2014 1040          Assessment / Plan:       ICD-9-CM ICD-10-CM   1. Hypercholesteremia 272.0 E78.0   2. Bronchial asthma 493.90 J45.909     Hypercholesterolemia lipid numbers from August 17 showed that his LDL is now increased to 130 with a total cholesterol of 240 the patient was encouraged to be  On a low-cholesterol diet and to be more compliant with his medications as he states he often forgets his statin.  He will repeat his LFTs and lipid profile in 3 months.    2 bronchial asthma stable on current inhalers no change in medications needed.    Patient to return for followup in 3 months.  Immunization History   Administered Date(s) Administered    Influenza Adult(44yr and up) 06/05/2011    Influenza Injectable Quadrivalent PF 06/28/2012    Influenza Injectable Quadrivalent with preservati 06/12/2013    Influenza Whole 07/08/2007, 05/04/2008, 05/06/2009    Tdap 03/02/2008            No orders of the defined types were placed in this encounter.     Patient's Medications   New Prescriptions    No medications on file   Previous Medications    ALBUTEROL (PROVENTIL, VENTOLIN, PROAIR HFA) 108 (90 BASE) MCG/ACT INHALER    Inhale 1-2 puffs into the lungs every 6 hours as needed for Wheezing   Shake well before each use.    ASPIRIN 81 MG TABLET    TAKE 1 TABLET DAILY.    BUDESONIDE (PULMICORT FLEXHALER) 180 MCG/ACT FLEXHALER    Inhale 1 puff into the lungs 2 times daily    CALCIUM CARBONATE-VITAMIN D (CALTRATE 600+D) 600-400 MG-UNIT PER TABLET    TAKE 1 TABLET TWICE DAILY    FEXOFENADINE (ALLEGRA) 180 MG TABLET    TAKE 1 TABLET DAILY.  LIDOCAINE (LIDODERM) 5 % PATCH        NASONEX 50 MCG/ACT NASAL SPRAY    SPRAY TWICE IN EACH NOSTRIL EVERY DAY    SIMVASTATIN (ZOCOR) 40 MG TABLET    Take 1 tablet (40 mg total) by mouth daily (with dinner)   Modified Medications    No medications on file   Discontinued Medications    No medications on file       Signed: Oneal Grout, MD on 04/30/2014 at 7:24 PM

## 2014-08-11 ENCOUNTER — Other Ambulatory Visit: Payer: Self-pay | Admitting: Primary Care

## 2014-08-11 NOTE — Telephone Encounter (Signed)
Last office visit 04-30-14

## 2014-10-13 ENCOUNTER — Other Ambulatory Visit: Payer: Self-pay | Admitting: Primary Care

## 2014-10-13 NOTE — Telephone Encounter (Signed)
Last seen 04/30/2014

## 2014-11-03 ENCOUNTER — Ambulatory Visit: Payer: Self-pay | Admitting: Primary Care

## 2014-11-03 ENCOUNTER — Encounter: Payer: Self-pay | Admitting: Primary Care

## 2014-11-03 VITALS — BP 122/82 | HR 100 | Ht 61.0 in | Wt 210.9 lb

## 2014-11-03 DIAGNOSIS — E7800 Pure hypercholesterolemia, unspecified: Secondary | ICD-10-CM

## 2014-11-03 DIAGNOSIS — J45909 Unspecified asthma, uncomplicated: Secondary | ICD-10-CM

## 2014-11-03 NOTE — Progress Notes (Signed)
NOTE  Subjective:      Patient ID: Carl Olson is a 54 y.o. year old male.    Chief Complaint   Patient presents with    Follow-up       HPI: 54 year-old male with a history of bronchial asthma and hypercholesterolemia who is here for followup.  He denies any increased shortness of breath no wheezing at night and does not wake up frequently does not need to use the rescue inhaler.  He also reports no muscle aches or nausea with the use of simvastatin        Medication reviewed and updated.    Allergy /Medications/ Problem List/ Social History / :  Allergies   Allergen Reactions    Moxifloxacin      Created by Conversion - 0;      Current Outpatient Prescriptions on File Prior to Visit   Medication Sig Dispense Refill    simvastatin (ZOCOR) 40 MG tablet TAKE 1 TABLET BY MOUTH DAILY WITH DINNER 30 tablet 0    NASONEX 50 MCG/ACT nasal spray SPRAY TWICE IN EACH NOSTRIL EVERY DAY 17 g 2    budesonide (PULMICORT FLEXHALER) 180 MCG/ACT flexhaler Inhale 1 puff into the lungs 2 times daily 1 g 5    aspirin 81 MG tablet TAKE 1 TABLET DAILY.  0    Calcium Carbonate-Vitamin D (CALTRATE 600+D) 600-400 MG-UNIT per tablet TAKE 1 TABLET TWICE DAILY 60 5    fexofenadine (ALLEGRA) 180 MG tablet TAKE 1 TABLET DAILY. 30 5    albuterol (PROVENTIL, VENTOLIN, PROAIR HFA) 108 (90 BASE) MCG/ACT inhaler Inhale 1-2 puffs into the lungs every 6 hours as needed for Wheezing   Shake well before each use. 1 Inhaler 0    lidocaine (LIDODERM) 5 % patch        No current facility-administered medications on file prior to visit.     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    Asthmatic Bronchitis J45.909    Neck Injury S19.9XXA,  [id number]    Acute Chest Wall Trauma 959.1    Vitamin D Deficiency E55.9    Sinusitis J32.9    Bronchial asthma J45.909    Bursitis of right patella M70.51    Pain in joint, lower leg M25.569     History   Substance Use Topics    Smoking status: Never Smoker     Smokeless tobacco: Never Used    Alcohol Use: 1.8 oz/week     3 Cans of beer per week         Objective:       Physical Exam:    Filed Vitals:    11/03/14 1521   BP: 122/82   Pulse: 100   Height: 1.549 m (5\' 1" )   Weight: 95.664 kg (210 lb 14.4 oz)      Estimated body mass index is 39.87 kg/(m^2) as calculated from the following:    Height as of this encounter: 1.549 m (5\' 1" ).    Weight as of this encounter: 95.664 kg (210 lb 14.4 oz).   SpO2 Readings from Last 3 Encounters:  07/23/13 96%   05/28/13 99%   03/07/13 98%       General:  Alert, cooperative, nontoxic, and in NAD.  Obese male  Head:  Atraumatic, normocephelic, symmetrical without deformities.   Eyes: No conjunctival injection, ptosis, or sclerus icterus.   Ears:  External ear exam without abnormalities. Hearing intact  Neck:   No cervical lymphadenopathy.  No carotid bruits or thyromegaly  Lungs:  CTA without wheezes, rales, or rhonchi.    Heart:  S1 and S2 are noted. RRR. No murmurs, rubs, or gallops.    Ext:  No clubbing, cyanosis, edema   Psych:  Good eye contact, appropriate affect, good historian     Recent Lab Results:        Component Value Date/Time    NA 142 03/30/2014 1040    K 4.5 03/30/2014 1040    CL 102 03/30/2014 1040    CO2 29* 03/30/2014 1040    UN 10 03/30/2014 1040    CREAT 1.21* 03/30/2014 1040    GFRC 68 03/30/2014 1040    GFRB 79 03/30/2014 1040    GLU 85 03/30/2014 1040    CA 9.8 03/30/2014 1040    VID25 22* 03/30/2014 1040    TSH 1.57 03/30/2014 1040    CHOL 205* 03/30/2014 1040    TRIG 107 03/30/2014 1040    HDL 54 03/30/2014 1040    LDLC 130 03/30/2014 1040          Component Value Date/Time    WBC 7.2 03/30/2014 1040    HGB 14.9 03/30/2014 1040    HCT 46  03/30/2014 1040    PLT 253 03/30/2014 1040          Assessment / Plan:       ICD-10-CM ICD-9-CM   1. Asthma J45.909 493.90   2. 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    2 hyperlipidemia stable on current medications patient to continue his atorvastatin he will obtain blood tests prior to his next appointment in 4 months.    Immunization History   Administered Date(s) Administered    Influenza Adult(18yr and up) 06/05/2011    Influenza Injectable Quadrivalent PF 06/28/2012    Influenza Injectable Quadrivalent with preservati 06/12/2013    Influenza Whole 07/08/2007, 05/04/2008, 05/06/2009    Tdap 03/02/2008            No orders of the defined types were placed in this encounter.     Patient's Medications   New Prescriptions    No medications on file   Previous Medications    ALBUTEROL (PROVENTIL, VENTOLIN, PROAIR HFA) 108 (90 BASE) MCG/ACT INHALER    Inhale 1-2 puffs into the lungs every 6 hours as needed for Wheezing   Shake well before each use.    ASPIRIN 81 MG TABLET    TAKE 1 TABLET DAILY.    BUDESONIDE (PULMICORT FLEXHALER) 180 MCG/ACT FLEXHALER    Inhale 1 puff into the lungs 2 times daily    CALCIUM CARBONATE-VITAMIN D (CALTRATE 600+D) 600-400 MG-UNIT PER TABLET    TAKE 1 TABLET TWICE DAILY    FEXOFENADINE (ALLEGRA) 180 MG TABLET    TAKE 1 TABLET DAILY.    LIDOCAINE (LIDODERM) 5 % PATCH        NASONEX 50 MCG/ACT NASAL SPRAY    SPRAY TWICE IN EACH NOSTRIL EVERY DAY    SIMVASTATIN (ZOCOR) 40 MG TABLET    TAKE  1 TABLET BY MOUTH DAILY WITH DINNER   Modified Medications    No medications on file   Discontinued Medications    No medications on file       Signed: Oneal Grout, MD on 11/03/2014 at 3:36 PM

## 2014-11-10 ENCOUNTER — Other Ambulatory Visit: Payer: Self-pay | Admitting: Primary Care

## 2014-11-10 DIAGNOSIS — J45909 Unspecified asthma, uncomplicated: Secondary | ICD-10-CM

## 2014-11-10 MED ORDER — ALBUTEROL SULFATE HFA 108 (90 BASE) MCG/ACT IN AERS *I*
1.0000 | INHALATION_SPRAY | Freq: Four times a day (QID) | RESPIRATORY_TRACT | Status: DC | PRN
Start: 2014-11-10 — End: 2016-05-09

## 2014-11-11 ENCOUNTER — Other Ambulatory Visit: Payer: Self-pay | Admitting: Primary Care

## 2014-11-11 MED ORDER — MOMETASONE FUROATE 50 MCG/ACT NA SUSP *I*
2.0000 | Freq: Every day | NASAL | Status: DC
Start: 2014-11-11 — End: 2015-07-22

## 2014-11-11 NOTE — Telephone Encounter (Signed)
Last office visit 11-03-14

## 2014-11-16 ENCOUNTER — Other Ambulatory Visit: Payer: Self-pay | Admitting: Primary Care

## 2015-03-04 ENCOUNTER — Ambulatory Visit: Payer: Self-pay | Admitting: Primary Care

## 2015-03-20 ENCOUNTER — Ambulatory Visit
Admit: 2015-03-20 | Discharge: 2015-03-20 | Disposition: A | Discharge status: Home / Self Care | Payer: Self-pay | Source: Ambulatory Visit | Attending: Primary Care | Admitting: Primary Care

## 2015-03-20 DIAGNOSIS — E7800 Pure hypercholesterolemia, unspecified: Secondary | ICD-10-CM

## 2015-03-20 LAB — COMPREHENSIVE METABOLIC PANEL
ALT: 30 U/L (ref 0–50)
AST: 22 U/L (ref 0–50)
Albumin: 4.6 g/dL (ref 3.5–5.2)
Alk Phos: 59 U/L (ref 40–130)
Anion Gap: 12 (ref 7–16)
Bilirubin,Total: 0.6 mg/dL (ref 0.0–1.2)
CO2: 27 mmol/L (ref 20–28)
Calcium: 9.6 mg/dL (ref 8.6–10.2)
Chloride: 102 mmol/L (ref 96–108)
Creatinine: 1.27 mg/dL — ABNORMAL HIGH (ref 0.67–1.17)
GFR,Black: 74 *
GFR,Caucasian: 64 *
Glucose: 102 mg/dL — ABNORMAL HIGH (ref 60–99)
Lab: 15 mg/dL (ref 6–20)
Potassium: 4.3 mmol/L (ref 3.3–5.1)
Sodium: 141 mmol/L (ref 133–145)
Total Protein: 6.9 g/dL (ref 6.3–7.7)

## 2015-03-20 LAB — LIPID PANEL
Chol/HDL Ratio: 3.3
Cholesterol: 168 mg/dL
HDL: 51 mg/dL
LDL Calculated: 99 mg/dL
Non HDL Cholesterol: 117 mg/dL
Triglycerides: 92 mg/dL

## 2015-03-20 LAB — TSH: TSH: 1.87 u[IU]/mL (ref 0.27–4.20)

## 2015-04-06 ENCOUNTER — Other Ambulatory Visit: Payer: Self-pay | Admitting: Primary Care

## 2015-04-06 NOTE — Telephone Encounter (Signed)
Last office visit 11-03-14

## 2015-04-29 ENCOUNTER — Ambulatory Visit: Payer: Self-pay | Admitting: Primary Care

## 2015-04-29 ENCOUNTER — Encounter: Payer: Self-pay | Admitting: Primary Care

## 2015-04-29 VITALS — BP 118/78 | HR 90 | Ht 61.0 in | Wt 201.5 lb

## 2015-04-29 DIAGNOSIS — E7800 Pure hypercholesterolemia, unspecified: Secondary | ICD-10-CM

## 2015-04-29 DIAGNOSIS — J3089 Other allergic rhinitis: Secondary | ICD-10-CM

## 2015-04-29 NOTE — Progress Notes (Signed)
NOTE  Subjective:      Patient ID: Carl Olson is a 54 y.o. year old male.    Chief Complaint   Patient presents with    Asthma       HPI: 54 year old male with a history of bronchial asthma and allergies to the environment along with hypercholesterolemia who is here for follow-up.  Patient reports taking his statin regularly he denies any muscle aches or nausea.  He is also not had any problem with asthma as long as he takes his allergy medications regularly.  No wheezing or shortness of breath and patient has not needed to use his inhalers.  Patient also reports problems with his hands and his wrists and achiness which she relates to his job.        Medication reviewed and updated.    Allergy /Medications/ Problem List/ Social History / :  Allergies   Allergen Reactions    Moxifloxacin      Created by Conversion - 0;      Current Outpatient Prescriptions on File Prior to Visit   Medication Sig Dispense Refill    PULMICORT FLEXHALER 180 MCG/ACT flexhaler INHALE 1 PUFF INTO THE LUNG TWICE DAILY 1 g 2    simvastatin (ZOCOR) 40 MG tablet TAKE 1 TABLET BY MOUTH EVERY DAY WITH DINNER 90 tablet 1    mometasone (NASONEX) 50 MCG/ACT nasal spray 2 sprays by Nasal route daily 17 g 3    aspirin 81 MG tablet TAKE 1 TABLET DAILY.  0    Calcium Carbonate-Vitamin D (CALTRATE 600+D) 600-400 MG-UNIT per tablet TAKE 1 TABLET TWICE DAILY 60 5    fexofenadine (ALLEGRA) 180 MG tablet TAKE 1 TABLET DAILY. 30 5    albuterol HFA 108 (90 BASE) MCG/ACT inhaler Inhale 1-2 puffs into the lungs every 6 hours as needed for Wheezing   Shake well before each use. 3 Inhaler 1    lidocaine (LIDODERM) 5 % patch        No current facility-administered medications on file prior to visit.      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    Asthmatic Bronchitis J45.909    Neck Injury S19.9XXA, [id number]    Acute Chest Wall Trauma 959.1    Vitamin D Deficiency E55.9    Sinusitis J32.9    Bronchial asthma J45.909    Bursitis of right patella M70.51    Pain in joint, lower leg M25.569     Social History   Substance Use Topics    Smoking status: Never Smoker    Smokeless tobacco: Never Used    Alcohol use 1.8 oz/week     3 Cans of beer per week         Objective:       Physical Exam:    Vitals:    04/29/15 1549   BP: 118/78   Pulse: 90   Weight: 91.4 kg (201 lb 8 oz)   Height: 1.549 m (5\' 1" )      Estimated body mass index is 38.07 kg/(m^2) as calculated from the following:    Height as of this encounter:  1.549 m (5\' 1" ).    Weight as of this encounter: 91.4 kg (201 lb 8 oz).   SpO2 Readings from Last 3 Encounters:   04/29/15 98%   07/23/13 96%   05/28/13 99%       General:  Alert, cooperative, nontoxic, and in NAD.  Obese male  Head:  Atraumatic, normocephelic, symmetrical without deformities.   Eyes: No conjunctival injection, ptosis, or sclerus icterus.   Ears:  External ear exam without abnormalities. Hearing intact  Neck:   No cervical lymphadenopathy. Supple. Nontender.  No thyromegaly and no carotid bruits Lungs:  CTA without wheezes, rales, or rhonchi.    Heart:  S1 and S2 are noted. RRR. No murmurs, rubs, or gallops.    Ext:  No clubbing, cyanosis, edema .  Hands were examined he does have a ganglion cyst on the dorsum of the left wrist as well as the flexor surface of the right wrist.  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    Psych:  Good eye contact, appropriate affect, good historian     Recent Lab Results:        Component Value Date/Time    NA 141 03/20/2015 0859    K 4.3 03/20/2015 0859    CL 102 03/20/2015 0859    CO2 27 03/20/2015 0859    UN 15 03/20/2015 0859    CREAT 1.27 (H) 03/20/2015 0859    GFRC 64 03/20/2015 0859     GFRB 74 03/20/2015 0859    GLU 102 (H) 03/20/2015 0859    CA 9.6 03/20/2015 0859    VID25 22 (L) 03/30/2014 1040    TSH 1.87 03/20/2015 0859    CHOL 168 03/20/2015 0859    TRIG 92 03/20/2015 0859    HDL 51 03/20/2015 0859    LDLC 99 03/20/2015 0859          Component Value Date/Time    WBC 7.2 03/30/2014 1040    HGB 14.9 03/30/2014 1040    HCT 46 03/30/2014 1040    PLT 253 03/30/2014 1040          Assessment / Plan:       ICD-10-CM ICD-9-CM   1. 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 Other allergic rhinitis J30.89 477.8     Hypercholesterolemia.  Patient continues on his statin regularly.  2 history of bronchial asthma no recent problems.  Patient does not need to use his inhalers at all for the last several months.  3 joint pain affecting the hands and the redness probably related to patient's job he was reassured advised to use Aspercreme to the joints as needed.  Follow-up in 2 months patient refuses the flu shot today           Patient's Medications   New Prescriptions    No medications on file   Previous Medications    ALBUTEROL HFA 108 (90 BASE) MCG/ACT INHALER    Inhale 1-2 puffs into the lungs every 6 hours as needed for Wheezing   Shake well before each use.    ASPIRIN 81 MG TABLET    TAKE 1 TABLET DAILY.    CALCIUM CARBONATE-VITAMIN D (CALTRATE 600+D) 600-400 MG-UNIT PER TABLET    TAKE 1 TABLET TWICE DAILY    FEXOFENADINE (ALLEGRA) 180 MG TABLET    TAKE 1 TABLET DAILY.    LIDOCAINE (LIDODERM) 5 % PATCH        MOMETASONE (NASONEX) 50 MCG/ACT NASAL SPRAY    2 sprays  by Nasal route daily    PULMICORT FLEXHALER 180 MCG/ACT FLEXHALER    INHALE 1 PUFF INTO THE LUNG TWICE DAILY    SIMVASTATIN (ZOCOR) 40 MG TABLET    TAKE 1 TABLET BY MOUTH EVERY DAY WITH DINNER   Modified Medications    No medications on file   Discontinued Medications    No medications on file       Signed: Oneal Grout, MD on 04/29/2015 at 4:20 PM

## 2015-05-21 ENCOUNTER — Other Ambulatory Visit: Payer: Self-pay | Admitting: Primary Care

## 2015-05-21 NOTE — Telephone Encounter (Signed)
Last office visit 04/29/15

## 2015-07-20 ENCOUNTER — Ambulatory Visit: Payer: Self-pay | Admitting: Primary Care

## 2015-07-20 ENCOUNTER — Encounter: Payer: Self-pay | Admitting: Primary Care

## 2015-07-20 VITALS — BP 130/90 | HR 84 | Ht 61.0 in | Wt 203.3 lb

## 2015-07-20 DIAGNOSIS — J45909 Unspecified asthma, uncomplicated: Secondary | ICD-10-CM

## 2015-07-20 DIAGNOSIS — E7800 Pure hypercholesterolemia, unspecified: Secondary | ICD-10-CM

## 2015-07-20 DIAGNOSIS — R7309 Other abnormal glucose: Secondary | ICD-10-CM | POA: Insufficient documentation

## 2015-07-20 NOTE — Patient Instructions (Signed)
Patient advised weight reduction

## 2015-07-20 NOTE — Progress Notes (Signed)
NOTE  Subjective:      Patient ID: Carl Olson is a 54 y.o. year old male.    Chief Complaint   Patient presents with    Asthma     follow up       HPI: 54 year old male here for follow-up he has a history of bronchial asthma and hypercholesterolemia states good compliance with his inhalers and his medications.  He does continue to wheeze and needs his inhalers.  He also denies muscle aches or nausea.        Medication reviewed and updated.    Allergy /Medications/ Problem List/ Social History / :  Allergies   Allergen Reactions    Moxifloxacin      Created by Conversion - 0;      Current Outpatient Prescriptions on File Prior to Visit   Medication Sig Dispense Refill    simvastatin (ZOCOR) 40 MG tablet TAKE 1 TABLET BY MOUTH EVERY DAY WITH DINNER 90 tablet 2    PULMICORT FLEXHALER 180 MCG/ACT flexhaler INHALE 1 PUFF INTO THE LUNG TWICE DAILY 1 g 2    mometasone (NASONEX) 50 MCG/ACT nasal spray 2 sprays by Nasal route daily 17 g 3    aspirin 81 MG tablet TAKE 1 TABLET DAILY.  0    Calcium Carbonate-Vitamin D (CALTRATE 600+D) 600-400 MG-UNIT per tablet TAKE 1 TABLET TWICE DAILY 60 5    fexofenadine (ALLEGRA) 180 MG tablet TAKE 1 TABLET DAILY. 30 5    albuterol HFA 108 (90 BASE) MCG/ACT inhaler Inhale 1-2 puffs into the lungs every 6 hours as needed for Wheezing   Shake well before each use. 3 Inhaler 1    lidocaine (LIDODERM) 5 % patch        No current facility-administered medications on file prior to visit.      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    Asthmatic Bronchitis J45.909    Neck Injury S19.9XXA, [id number]    Acute Chest Wall Trauma 959.1    Vitamin D Deficiency E55.9    Sinusitis J32.9     Bronchial asthma J45.909    Bursitis of right patella M70.51    Pain in joint, lower leg M25.569    Increased glucose level R73.09     Social History   Substance Use Topics    Smoking status: Never Smoker    Smokeless tobacco: Never Used    Alcohol use 1.8 oz/week     3 Cans of beer per week         Objective:       Physical Exam:    Vitals:    07/20/15 1438   BP: 130/90   Pulse: 84   Weight: 92.2 kg (203 lb 4.8 oz)   Height: 1.549 m (5\' 1" )      Estimated body mass index is 38.41 kg/(m^2) as calculated from the following:    Height as of this encounter: 1.549 m (5\' 1" ).    Weight as of this encounter: 92.2 kg (203 lb 4.8 oz).   SpO2 Readings from Last 3 Encounters:   07/20/15 96%   04/29/15 98%  07/23/13 96%       General:  Alert, cooperative, nontoxic, and in NAD.  Obese male  Head:  Atraumatic, normocephelic, symmetrical without deformities.   Eyes: No conjunctival injection, ptosis, or sclerus icterus.   Ears:  External ear exam without abnormalities. Hearing intact  Neck:   No cervical lymphadenopathy. Supple. Nontender.  Lungs:  Bilateral scattered wheezes and rhonchi  Heart:  S1 and S2 are noted. RRR. No murmurs, rubs, or gallops.    Ext:  No clubbing, cyanosis, edema   Psych:  Good eye contact, appropriate affect, good historian     Recent Lab Results:        Component Value Date/Time    NA 141 03/20/2015 0859    K 4.3 03/20/2015 0859    CL 102 03/20/2015 0859    CO2 27 03/20/2015 0859    UN 15 03/20/2015 0859    CREAT 1.27 (H) 03/20/2015 0859    GFRC 64 03/20/2015 0859    GFRB 74 03/20/2015 0859    GLU 102 (H) 03/20/2015 0859    CA 9.6 03/20/2015 0859    VID25 22 (L) 03/30/2014 1040    TSH 1.87 03/20/2015 0859    CHOL 168 03/20/2015 0859    TRIG 92 03/20/2015 0859    HDL 51 03/20/2015 0859    LDLC 99 03/20/2015 0859          Component Value Date/Time    WBC 7.2 03/30/2014 1040    HGB 14.9 03/30/2014 1040    HCT 46 03/30/2014 1040    PLT 253 03/30/2014 1040          Assessment / Plan:       ICD-10-CM  ICD-9-CM   1. Bronchial asthma J45.909 493.90   2. Familial Hypercholesterolemia E78.00 272.0   3. Increased glucose level R73.09 790.29     Bronchial asthma patient reports good compliance with his medications he does need to use his inhalers has not had any increased wheezing or shortness of breath.  2 hypercholesterolemia patient tolerating simvastatin 40 mg daily with no muscle aches or nausea  3 history of increased glucose in the of 102 and obesity patient counseled about weight loss and diet and exercise    He will get the flu shot today.  He will obtain lab tests.  Follow-up in 4 months.  Immunization History   Administered Date(s) Administered    Influenza Adult(68yr and up) 06/05/2011    Influenza Injectable Quadrivalent PF 06/28/2012, 07/20/2015    Influenza Injectable Quadrivalent with preservati 06/12/2013    Influenza Whole 07/08/2007, 05/04/2008, 05/06/2009    Tdap 03/02/2008            Orders Placed This Encounter   Procedures    Flu vaccine quadrivalent greater than or equal to 3yo preservative free IM    CBC and differential    Comprehensive metabolic panel    Lipid add Rfx to Drt LDL if Trig >400    Hemoglobin A1c     Patient's Medications   New Prescriptions    No medications on file   Previous Medications    ALBUTEROL HFA 108 (90 BASE) MCG/ACT INHALER    Inhale 1-2 puffs into the lungs every 6 hours as needed for Wheezing   Shake well before each use.    ASPIRIN 81 MG TABLET    TAKE 1 TABLET DAILY.    CALCIUM CARBONATE-VITAMIN D (CALTRATE 600+D) 600-400 MG-UNIT PER TABLET    TAKE 1 TABLET TWICE DAILY  FEXOFENADINE (ALLEGRA) 180 MG TABLET    TAKE 1 TABLET DAILY.    LIDOCAINE (LIDODERM) 5 % PATCH        MOMETASONE (NASONEX) 50 MCG/ACT NASAL SPRAY    2 sprays by Nasal route daily    PULMICORT FLEXHALER 180 MCG/ACT FLEXHALER    INHALE 1 PUFF INTO THE LUNG TWICE DAILY    SIMVASTATIN (ZOCOR) 40 MG TABLET    TAKE 1 TABLET BY MOUTH EVERY DAY WITH DINNER   Modified Medications    No  medications on file   Discontinued Medications    No medications on file       Signed: Oneal Grout, MD on 07/20/2015 at 6:19 PM

## 2015-07-22 ENCOUNTER — Other Ambulatory Visit: Payer: Self-pay | Admitting: Primary Care

## 2015-07-22 NOTE — Telephone Encounter (Signed)
Last office visit 09-08-14

## 2016-01-13 ENCOUNTER — Encounter: Payer: Self-pay | Admitting: Primary Care

## 2016-01-13 ENCOUNTER — Ambulatory Visit: Payer: Self-pay | Admitting: Primary Care

## 2016-01-13 VITALS — BP 120/76 | HR 93 | Ht 61.0 in | Wt 199.8 lb

## 2016-01-13 DIAGNOSIS — E7800 Pure hypercholesterolemia, unspecified: Secondary | ICD-10-CM

## 2016-01-13 DIAGNOSIS — J309 Allergic rhinitis, unspecified: Secondary | ICD-10-CM

## 2016-01-13 NOTE — Progress Notes (Signed)
NOTE  Subjective:      Patient ID: Carl Olson is a 55 y.o. year old male.    Chief Complaint   Patient presents with    Follow-up       HPI: 55 year old male with a history of hypercholesterolemia and allergies to the environment who is here for follow-up.  He takes his simvastatin regularly he has not had his lipids checked however.  He denies any problems with his allergies or reactive bronchospasm and has not needed to use his Pulmicort.  He does complain of some mild low back pain intermittently.        Medication reviewed and updated.    Allergy /Medications/ Problem List/ Social History / :  Allergies   Allergen Reactions    Moxifloxacin      Created by Conversion - 0;      Current Outpatient Prescriptions on File Prior to Visit   Medication Sig Dispense Refill    mometasone (NASONEX) 50 MCG/ACT nasal spray SHAKE WELL AND USE 2 SPRAYS IN EACH NOSTRIL DAILY 17 g 3    simvastatin (ZOCOR) 40 MG tablet TAKE 1 TABLET BY MOUTH EVERY DAY WITH DINNER 90 tablet 2    PULMICORT FLEXHALER 180 MCG/ACT flexhaler INHALE 1 PUFF INTO THE LUNG TWICE DAILY 1 g 2    lidocaine (LIDODERM) 5 % patch       aspirin 81 MG tablet TAKE 1 TABLET DAILY.  0    Calcium Carbonate-Vitamin D (CALTRATE 600+D) 600-400 MG-UNIT per tablet TAKE 1 TABLET TWICE DAILY 60 5    fexofenadine (ALLEGRA) 180 MG tablet TAKE 1 TABLET DAILY. 30 5    albuterol HFA 108 (90 BASE) MCG/ACT inhaler Inhale 1-2 puffs into the lungs every 6 hours as needed for Wheezing   Shake well before each use. 3 Inhaler 1     No current facility-administered medications on file prior to visit.      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    Asthmatic Bronchitis J45.909     Neck Injury S19.9XXA, [id number]    Acute Chest Wall Trauma 959.1    Vitamin D Deficiency E55.9    Sinusitis J32.9    Bronchial asthma J45.909    Bursitis of right patella M70.51    Pain in joint, lower leg M25.569    Increased glucose level R73.09     Social History   Substance Use Topics    Smoking status: Never Smoker    Smokeless tobacco: Never Used    Alcohol use 1.8 oz/week     3 Cans of beer per week         Objective:       Physical Exam:    Vitals:    01/13/16 1458   BP: 120/76   Pulse: 93   Weight: 90.6 kg (199 lb 12.8 oz)   Height: 1.549 m (5\' 1" )      Estimated body mass index is 37.75 kg/(m^2) as calculated from the following:    Height as of this encounter: 1.549 m (5\' 1" ).    Weight as of this encounter: 90.6 kg (199 lb 12.8  oz).   SpO2 Readings from Last 3 Encounters:   01/13/16 100%   07/20/15 96%   04/29/15 98%       General:  Alert, cooperative, nontoxic, and in NAD.  Obese male  Head:  Atraumatic, normocephelic, symmetrical without deformities.   Eyes: No conjunctival injection, ptosis, or sclerus icterus.   Ears:  External ear exam without abnormalities. Hearing intact  Neck:   No cervical lymphadenopathy.  No thyromegaly or carotid bruits  Lungs:  CTA without wheezes, rales, or rhonchi.    Heart:  S1 and S2 are noted. RRR. No murmurs, rubs, or gallops.    Back exam minimal tenderness no restriction of movements  Ext:  No clubbing, cyanosis, edema   Psych:  Good eye contact, appropriate affect, good historian     Recent Lab Results: None since last August        Assessment / Plan:       ICD-10-CM ICD-9-CM   1. Allergic rhinitis J30.9 477.9   2. 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    2 hypercholesterolemia patient to continue simvastatin knee does need to repeat his LFTs.    3 intermittent lower back pain which appears to be muscular.    4 obesity patient counseled about trying to lose weight he has lost  about 4 pounds.    Follow-up in 4 months.           Orders Placed This Encounter   Procedures    CBC and differential    Comprehensive metabolic panel    Lipid add Rfx to Drt LDL if Trig >400    TSH    T4, free     Patient's Medications   New Prescriptions    No medications on file   Previous Medications    ALBUTEROL HFA 108 (90 BASE) MCG/ACT INHALER    Inhale 1-2 puffs into the lungs every 6 hours as needed for Wheezing   Shake well before each use.    ASPIRIN 81 MG TABLET    TAKE 1 TABLET DAILY.    CALCIUM CARBONATE-VITAMIN D (CALTRATE 600+D) 600-400 MG-UNIT PER TABLET    TAKE 1 TABLET TWICE DAILY    FEXOFENADINE (ALLEGRA) 180 MG TABLET    TAKE 1 TABLET DAILY.    LIDOCAINE (LIDODERM) 5 % PATCH        MOMETASONE (NASONEX) 50 MCG/ACT NASAL SPRAY    SHAKE WELL AND USE 2 SPRAYS IN EACH NOSTRIL DAILY    PULMICORT FLEXHALER 180 MCG/ACT FLEXHALER    INHALE 1 PUFF INTO THE LUNG TWICE DAILY    SIMVASTATIN (ZOCOR) 40 MG TABLET    TAKE 1 TABLET BY MOUTH EVERY DAY WITH DINNER   Modified Medications    No medications on file   Discontinued Medications    No medications on file       Signed: Oneal Grout, MD on 01/13/2016 at 3:33 PM

## 2016-01-22 ENCOUNTER — Other Ambulatory Visit
Admission: RE | Admit: 2016-01-22 | Discharge: 2016-01-22 | Disposition: A | Discharge status: Home / Self Care | Payer: Self-pay | Source: Ambulatory Visit | Attending: Primary Care | Admitting: Primary Care

## 2016-01-22 DIAGNOSIS — J45909 Unspecified asthma, uncomplicated: Secondary | ICD-10-CM

## 2016-01-22 DIAGNOSIS — R7309 Other abnormal glucose: Secondary | ICD-10-CM

## 2016-01-22 DIAGNOSIS — J309 Allergic rhinitis, unspecified: Secondary | ICD-10-CM

## 2016-01-22 DIAGNOSIS — E7800 Pure hypercholesterolemia, unspecified: Secondary | ICD-10-CM

## 2016-01-22 LAB — COMPREHENSIVE METABOLIC PANEL
ALT: 29 U/L (ref 0–50)
AST: 24 U/L (ref 0–50)
Albumin: 4.8 g/dL (ref 3.5–5.2)
Alk Phos: 61 U/L (ref 40–130)
Anion Gap: 15 (ref 7–16)
Bilirubin,Total: 0.7 mg/dL (ref 0.0–1.2)
CO2: 26 mmol/L (ref 20–28)
Calcium: 9.8 mg/dL (ref 8.6–10.2)
Chloride: 100 mmol/L (ref 96–108)
Creatinine: 1.31 mg/dL — ABNORMAL HIGH (ref 0.67–1.17)
GFR,Black: 71 *
GFR,Caucasian: 61 *
Glucose: 95 mg/dL (ref 60–99)
Lab: 15 mg/dL (ref 6–20)
Potassium: 4.4 mmol/L (ref 3.3–5.1)
Sodium: 141 mmol/L (ref 133–145)
Total Protein: 6.8 g/dL (ref 6.3–7.7)

## 2016-01-22 LAB — CBC AND DIFFERENTIAL
Baso # K/uL: 0 10*3/uL (ref 0.0–0.1)
Basophil %: 0.5 %
Eos # K/uL: 0.1 10*3/uL (ref 0.0–0.5)
Eosinophil %: 0.8 %
Hematocrit: 47 % (ref 40–51)
Hemoglobin: 15.4 g/dL (ref 13.7–17.5)
IMM Granulocytes #: 0 10*3/uL (ref 0.0–0.1)
IMM Granulocytes: 0.2 %
Lymph # K/uL: 2.3 10*3/uL (ref 1.3–3.6)
Lymphocyte %: 26.5 %
MCH: 28 pg/cell (ref 26–32)
MCHC: 33 g/dL (ref 32–37)
MCV: 85 fL (ref 79–92)
Mono # K/uL: 0.6 10*3/uL (ref 0.3–0.8)
Monocyte %: 6.9 %
Neut # K/uL: 5.7 10*3/uL — ABNORMAL HIGH (ref 1.8–5.4)
Nucl RBC # K/uL: 0 10*3/uL (ref 0.0–0.0)
Nucl RBC %: 0 /100 WBC (ref 0.0–0.2)
Platelets: 249 10*3/uL (ref 150–330)
RBC: 5.5 MIL/uL (ref 4.6–6.1)
RDW: 13.7 % (ref 11.6–14.4)
Seg Neut %: 65.1 %
WBC: 8.7 10*3/uL (ref 4.2–9.1)

## 2016-01-22 LAB — LIPID PANEL
Chol/HDL Ratio: 3.4
Cholesterol: 168 mg/dL
HDL: 50 mg/dL
LDL Calculated: 98 mg/dL
Non HDL Cholesterol: 118 mg/dL
Triglycerides: 102 mg/dL

## 2016-01-22 LAB — T4, FREE: Free T4: 1.5 ng/dL (ref 0.9–1.7)

## 2016-01-22 LAB — TSH: TSH: 1.62 u[IU]/mL (ref 0.27–4.20)

## 2016-01-24 LAB — HEMOGLOBIN A1C: Hemoglobin A1C: 6 % (ref 4.0–6.0)

## 2016-02-22 ENCOUNTER — Other Ambulatory Visit: Payer: Self-pay | Admitting: Primary Care

## 2016-04-29 ENCOUNTER — Other Ambulatory Visit: Payer: Self-pay | Admitting: Primary Care

## 2016-05-09 ENCOUNTER — Ambulatory Visit: Payer: Self-pay | Admitting: Primary Care

## 2016-05-09 ENCOUNTER — Encounter: Payer: Self-pay | Admitting: Primary Care

## 2016-05-09 VITALS — BP 120/86 | HR 95 | Ht 61.0 in | Wt 201.6 lb

## 2016-05-09 DIAGNOSIS — J45909 Unspecified asthma, uncomplicated: Secondary | ICD-10-CM

## 2016-05-09 MED ORDER — ALBUTEROL SULFATE HFA 108 (90 BASE) MCG/ACT IN AERS *I*
1.0000 | INHALATION_SPRAY | Freq: Four times a day (QID) | RESPIRATORY_TRACT | 1 refills | Status: DC | PRN
Start: 2016-05-09 — End: 2017-12-21

## 2016-05-10 NOTE — Progress Notes (Signed)
NOTE  Subjective:      Patient ID: Trevell Olson is a 55 y.o. year old male.    Chief Complaint   Patient presents with    Cough       HPI: 55 year old male with a history of allergies and bronchial asthma who comes in with a four-day history of increased coughing and pain in the chest with coughing.  He denies any increased shortness of breath fevers or chills.        Medication reviewed and updated.    Allergy /Medications/ Problem List/ Social History / :  Allergies   Allergen Reactions    Moxifloxacin      Created by Conversion - 0;      Current Outpatient Prescriptions on File Prior to Visit   Medication Sig Dispense Refill    budesonide (PULMICORT FLEXHALER) 180 MCG/ACT flexhaler Inhale 1 puff into the lungs 2 times daily 1 Inhaler 3    simvastatin (ZOCOR) 40 MG tablet TAKE 1 TABLET BY MOUTH EVERY DAY WITH DINNER 90 tablet 1    mometasone (NASONEX) 50 MCG/ACT nasal spray SHAKE WELL AND USE 2 SPRAYS IN EACH NOSTRIL DAILY 17 g 3    aspirin 81 MG tablet TAKE 1 TABLET DAILY.  0    Calcium Carbonate-Vitamin D (CALTRATE 600+D) 600-400 MG-UNIT per tablet TAKE 1 TABLET TWICE DAILY 60 5    fexofenadine (ALLEGRA) 180 MG tablet TAKE 1 TABLET DAILY. 30 5    lidocaine (LIDODERM) 5 % patch        No current facility-administered medications on file prior to visit.      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    Asthmatic Bronchitis J45.909    Neck Injury S19.9XXA, [id number]    Acute Chest Wall Trauma 959.1    Vitamin D Deficiency E55.9    Sinusitis J32.9    Bronchial asthma J45.909    Bursitis of right patella M70.51    Pain in joint, lower leg M25.569    Increased glucose level R73.09     Social History    Substance Use Topics    Smoking status: Never Smoker    Smokeless tobacco: Never Used    Alcohol use 1.8 oz/week     3 Cans of beer per week         Objective:       Physical Exam:    Vitals:    05/09/16 1358   BP: 120/86   Pulse: 95   Weight: 91.4 kg (201 lb 9.6 oz)   Height: 1.549 m (5\' 1" )      Estimated body mass index is 38.09 kg/(m^2) as calculated from the following:    Height as of this encounter: 1.549 m (5\' 1" ).    Weight as of this encounter: 91.4 kg (201 lb 9.6 oz).   SpO2 Readings from Last 3 Encounters:   05/09/16 96%   01/13/16 100%   07/20/15 96%       General:  Alert, cooperative, nontoxic, and in NAD.  Respiratory rate is 12 and unlabored  Head:  Atraumatic, normocephelic, symmetrical without deformities.   Eyes:  No conjunctival injection, ptosis, or sclerus icterus.   Ears:  External ear exam without abnormalities. Hearing intact  Neck:   No cervical lymphadenopathy. Supple. Nontender.  Lungs:  CTA without wheezes, rales, or rhonchi.    Chest wall exam shows 3+ tenderness over the left third through the fifth ribs at the costochondral junctions  Heart:  S1 and S2 are noted. RRR. No murmurs, rubs, or gallops.    Ext:  No clubbing, cyanosis, edema   Psych:  Good eye contact, appropriate affect, good historian     Recent Lab Results:        Component Value Date/Time    NA 141 01/22/2016 0857    K 4.4 01/22/2016 0857    CL 100 01/22/2016 0857    CO2 26 01/22/2016 0857    UN 15 01/22/2016 0857    CREAT 1.31 (H) 01/22/2016 0857    GFRC 61 01/22/2016 0857    GFRB 71 01/22/2016 0857    GLU 95 01/22/2016 0857    HA1C 6.0 01/22/2016 0857    CA 9.8 01/22/2016 0857    VID25 22 (L) 03/30/2014 1040    TSH 1.62 01/22/2016 0857    CHOL 168 01/22/2016 0857    TRIG 102 01/22/2016 0857    HDL 50 01/22/2016 0857    LDLC 98 01/22/2016 0857          Component Value Date/Time    WBC 8.7 01/22/2016 0857    HGB 15.4 01/22/2016 0857    HCT 47 01/22/2016 0857    PLT 249 01/22/2016 0857          Assessment / Plan:        ICD-10-CM ICD-9-CM   1. 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  Patient was advised to start his allergy medications and continue his inhaler twice a day he has not been using the rescue inhaler and was advised to use Ventolin 1 puff 3 times a day for the next 5 days.  He was also advised to take Tylenol for the chest wall pain and use a heating pad as needed.  Patient to stay out of work for the next 4 days.    Immunization History   Administered Date(s) Administered    Influenza Adult(65yr and up) 06/05/2011    Influenza Injectable Quadrivalent PF 06/28/2012, 07/20/2015    Influenza Injectable Quadrivalent with preservati 06/12/2013    Influenza Whole 07/08/2007, 05/04/2008, 05/06/2009    Tdap 03/02/2008            No orders of the defined types were placed in this encounter.    Patient's Medications   New Prescriptions    No medications on file   Previous Medications    ASPIRIN 81 MG TABLET    TAKE 1 TABLET DAILY.    BUDESONIDE (PULMICORT FLEXHALER) 180 MCG/ACT FLEXHALER    Inhale 1 puff into the lungs 2 times daily    CALCIUM CARBONATE-VITAMIN D (CALTRATE 600+D) 600-400 MG-UNIT PER TABLET    TAKE 1 TABLET TWICE DAILY    FEXOFENADINE (ALLEGRA) 180 MG TABLET    TAKE 1 TABLET DAILY.    LIDOCAINE (LIDODERM) 5 % PATCH        MOMETASONE (NASONEX) 50 MCG/ACT NASAL SPRAY    SHAKE WELL AND USE 2 SPRAYS IN EACH NOSTRIL DAILY    SIMVASTATIN (ZOCOR) 40 MG TABLET    TAKE 1 TABLET BY MOUTH EVERY DAY WITH DINNER   Modified Medications  Modified Medication Previous Medication    ALBUTEROL HFA 108 (90 BASE) MCG/ACT INHALER albuterol HFA 108 (90 BASE) MCG/ACT inhaler       Inhale 1-2 puffs into the lungs every 6 hours as needed for Wheezing   Shake well before each use.    Inhale 1-2 puffs into the lungs every 6 hours as needed for Wheezing   Shake well before each use.   Discontinued Medications    No medications on file       Signed: Oneal Grout, MD on 05/10/2016 at 2:24 PM

## 2016-07-13 ENCOUNTER — Encounter: Payer: Self-pay | Admitting: Internal Medicine

## 2016-07-13 ENCOUNTER — Ambulatory Visit: Payer: Self-pay | Admitting: Internal Medicine

## 2016-07-13 VITALS — BP 112/80 | HR 80 | Temp 98.4°F | Ht 61.0 in | Wt 201.0 lb

## 2016-07-13 DIAGNOSIS — J45909 Unspecified asthma, uncomplicated: Secondary | ICD-10-CM

## 2016-07-13 DIAGNOSIS — Z23 Encounter for immunization: Secondary | ICD-10-CM

## 2016-07-13 LAB — PCMH DEPRESSION ASSESSMENT

## 2016-07-13 MED ORDER — AZITHROMYCIN 250 MG PO TABS *I*
ORAL_TABLET | ORAL | 0 refills | Status: AC
Start: 2016-07-13 — End: 2016-07-18

## 2016-07-13 NOTE — Progress Notes (Signed)
HPI Carl Olson is a 55 year old patient with history of allergic rhinitis, bronchial asthma, esophageal reflux, he presents today with a two-week history of cough productive of green sputum.  He states prior to that he had a cold for 1-2 weeks that then went into his chest.  Not he denies any current fever or chills    ROS HEENT patient has had nasal congestion, and postnasal drip.  He denies any sore throat or ear pain.  Respiratory patient does have cough productive of green mucus, he denies any hoarseness or hemoptysis.  Cardiovascular patient denies any palpitations, syncope, chest pain or shortness of breath.    Physical exam blood pressure is 112/80, pulse of 80, temperature is 36.9, O2 sat is 96%, pain is currently 0 out of 10.  HEENT throat is mildly injected with some hyperplasia nasal mucosa is mildly erythematous and edematous, tympanic membranes appear normal, does not have any sinus tenderness.  Neck is supple without lymphadenopathy.  Lungs revealed wheezing with few scattered rhonchi there is no egophony changes or rales.  Heart revealed normal S1 and S2 without murmur, gallop or rub.    Assessment and plan asthmatic bronchitis will continue with Pulmicort Flexhaler and albuterol inhaler as prescribed, add azithromycin 500 mg today followed by 250 mg days 2 through 5.  Increase fluids follow up as needed.  Flu vaccine was completed today, follow-up with PCP as planned

## 2016-08-28 ENCOUNTER — Other Ambulatory Visit: Payer: Self-pay | Admitting: Primary Care

## 2016-09-15 ENCOUNTER — Other Ambulatory Visit: Payer: Self-pay | Admitting: Primary Care

## 2016-12-27 ENCOUNTER — Encounter: Payer: Self-pay | Admitting: Primary Care

## 2016-12-27 ENCOUNTER — Ambulatory Visit: Payer: No Typology Code available for payment source | Attending: Primary Care | Admitting: Primary Care

## 2016-12-27 VITALS — BP 132/74 | HR 95 | Temp 99.2°F | Resp 12 | Wt 195.6 lb

## 2016-12-27 DIAGNOSIS — Z0000 Encounter for general adult medical examination without abnormal findings: Secondary | ICD-10-CM | POA: Insufficient documentation

## 2016-12-27 DIAGNOSIS — J029 Acute pharyngitis, unspecified: Secondary | ICD-10-CM | POA: Insufficient documentation

## 2016-12-27 DIAGNOSIS — M25511 Pain in right shoulder: Secondary | ICD-10-CM | POA: Insufficient documentation

## 2016-12-27 DIAGNOSIS — E559 Vitamin D deficiency, unspecified: Secondary | ICD-10-CM | POA: Insufficient documentation

## 2016-12-27 DIAGNOSIS — J3089 Other allergic rhinitis: Secondary | ICD-10-CM | POA: Insufficient documentation

## 2016-12-27 DIAGNOSIS — E7800 Pure hypercholesterolemia, unspecified: Secondary | ICD-10-CM

## 2016-12-27 DIAGNOSIS — K635 Polyp of colon: Secondary | ICD-10-CM

## 2016-12-27 DIAGNOSIS — D126 Benign neoplasm of colon, unspecified: Secondary | ICD-10-CM | POA: Insufficient documentation

## 2016-12-27 DIAGNOSIS — R7989 Other specified abnormal findings of blood chemistry: Secondary | ICD-10-CM

## 2016-12-27 DIAGNOSIS — J4540 Moderate persistent asthma, uncomplicated: Secondary | ICD-10-CM | POA: Insufficient documentation

## 2016-12-27 DIAGNOSIS — M25512 Pain in left shoulder: Secondary | ICD-10-CM | POA: Insufficient documentation

## 2016-12-27 HISTORY — DX: Polyp of colon: K63.5

## 2016-12-27 LAB — PCMH DEPRESSION ASSESSMENT

## 2016-12-27 LAB — POCT AMBULATORY RAPID STREP
Lot #: 171566
Rapid Strep Group A Throat-POC: NEGATIVE

## 2016-12-27 MED ORDER — CETIRIZINE HCL 10 MG PO TABS *I*
10.0000 mg | ORAL_TABLET | Freq: Every day | ORAL | 5 refills | Status: AC
Start: 2016-12-27 — End: 2017-06-25

## 2016-12-27 NOTE — Addendum Note (Signed)
Addended by: Catalina Antigua on: 12/27/2016 01:20 PM     Modules accepted: Orders

## 2016-12-27 NOTE — Progress Notes (Signed)
Patient ID: Carl Olson is a 56 y.o. year old male who presents today forNew Patient Visit    BP 132/74 (BP Location: Right arm, Patient Position: Sitting, Cuff Size: adult)   Pulse 95   Temp 37.3 C (99.2 F)   Resp 12   Wt 88.7 kg (195 lb 9.6 oz)   SpO2 98%   BMI 36.96 kg/m2  Estimated body mass index is 36.96 kg/(m^2) as calculated from the following:    Height as of 07/13/16: 1.549 m (5\' 1" ).    Weight as of this encounter: 88.7 kg (195 lb 9.6 oz).    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   He wears a mask sometimes at work depending on the kind of work.   He reports adherence to his allergy and asthma medications.        He has had a new issue of throat pain for the last two weeks getting worse over time.  He has had coughing as well over the last  weo weeks. Symptoms are similar to his past allergies.      ROS had some recent rhinitis, no fevers no chills.  Has had some mild dysphagia without odynophagia.       Knee pain he attributes some of his prroblems to his work clrawling in ducts sometimes.    He has hcornic back,  and hand pain.    He had seen ortho a few years ago and found to have prepatellar bursitis right knee.        New issue shoulder pain  - He has developed shoulder pain for the last few weeks initially right now left shoulder pain.  PMH he has had past shoulder surgery on the left 'to have scar tissue removed' (actually sounds like a liopma)        He has had a ganglino cyst right wrist not botherihg him.       esohpageal reflux no further problems since changing habits.        Medications and problem list reviewed   during visit      Allergy / Social History / Medications:     Allergies   Allergen Reactions    Moxifloxacin       Created by Conversion - 0;      Family History   Problem Relation Age of Onset    High Blood Pressure Mother     Diabetes Mother     Dementia Mother     No Known Problems Father     High Blood Pressure Sister     High Blood Pressure Sister     High Blood Pressure Sister     High Blood Pressure Sister     High Blood Pressure Sister      Past Medical History:   Diagnosis Date    Asthma     Bursitis of right patella 07/23/2013    Esophageal reflux 01/29/2006    Created by Conversion     Neuromuscular disorder     left shoulder surgery         Social History     Social History Narrative    Married, two step daughters.  5  grand stepchildren.  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  No acute distress.  Appears stated age.  Skin: No rash.  No atypical nevi.    Lymph nodes: No cervical, submandibular, supraclavicular, axillary, inguinal lymphadenopathy.  HEENT: Pupils symmetric.  Conjunctiva pink.  Sclera anicteric.  Turbinates without edema or erythema.  Oropharynx moist with no lesions but somewhat erythematous .  TMs are shiny .  Neck: No thyromegaly or thyroid nodules. No other masses, no crepitus. No carotid bruits.  No jugular venous distention.  Chest/Breast:   Recumbent exams.  No mass or tenderness.  Skin without dimpling or retraction.  No nipple discharge.  Lungs:  normal respiratory effort, he has mild diffuse rales   Cardiac:  Regular S1 and S2 with no murmur, rub, gallop.  PMI nondisplaced.  Abdomen: Soft, non tender, non distended.  No palpable HSM or mass.  Normal active bowel sounds.   Spine/musculoskeletal: Spine is non tender.   No joint deformities .  Good functional range of motion shoulders non tender good ROM     Extremities: No clubbing, cyanosis, edema.  Vascular: 2+ radial, brachial, femoral, dorsalis pedis pulses bilaterally.  Neurologic: Patient is alert and oriented.  Sensation intact to light touch  Gait is without ataxia.    Psych:  Appropriate historian , normal affect and interaction, good eye contact.         Recent Labs and Other Results     Lab Results   Component Value Date    NA 141 01/22/2016    K 4.4 01/22/2016    CL 100 01/22/2016    CO2 26 01/22/2016    UN 15 01/22/2016    CREAT 1.31 (H) 01/22/2016    VID25 22 (L) 03/30/2014    WBC 8.7 01/22/2016    HGB 15.4 01/22/2016    HCT 47 01/22/2016    PLT 249 01/22/2016    TSH 1.62 01/22/2016    CHOL 168 01/22/2016    TRIG 102 01/22/2016    HDL 50 01/22/2016    LDLC 98 01/22/2016    CHHDC 3.4 01/22/2016     Hemoglobin A1C   Date Value Ref Range Status   01/22/2016 6.0 4.0 - 6.0 % Final     Comment:                                   NON-DIABETIC       07/30/2013 8:49 AM  KNEE RT MIN 4 VIEWS    ORDERING CLINICAL INFORMATION:  ERECORD: pain  ADDITIONAL CLINICAL INFORMATION:  None.    COMPARISON:  None.    PROCEDURE:  AP, PA, lateral, and patellar views of the right knee    FINDINGS AND IMPRESSION:    Anterior soft tissue fullness consistent with pre-patellar bursitis.   No acute fracture  or dislocation. Joint spaces are well-maintained.       8yr ago     Surgical Pathology FINAL DIAGNOSIS:  Colon, cecum, biopsy:  - Tubular adenoma.            ASSESSMENT AND PLAN     1. Moderate persistent asthma without complication     2. High cholesterol  Lipid add Rfx to Drt LDL if Trig >400   3. Vitamin D deficiency  Vitamin D   4. Chronic allergic rhinitis due to other allergic trigger, unspecified seasonality  cetirizine (ZYRTEC) 10 MG tablet   5. Health care maintenance  Vitamin D    Basic metabolic panel    Lipid add Rfx to Drt LDL if Trig >400    Hemoglobin A1c    PSA  (eff.11-2008)    CBC   6. Elevated serum creatinine  Basic metabolic panel    CBC    Urinalysis with reflex to microscopic    Microalbumin, Urine, Random   7. Acute pain of both shoulders     8. Adenomatous polyp of colon, unspecified part of colon              Asthma incomplete control but having some problems with allergies.   Mild recnt ecaecrbation we discussed options of various meds will just treat allergic rhinitis initially he will call if no improvement.      Allergic rhinitis - Poorly controlled discussed option of monteleukast he defers will give trial of switching antihistamine to zyrtec. Trial of nettie pot as well.      Pharyngitis - negative rapid strep  Suspect allergic.     Shoulder pain new issue trial of home rehab    Vitamin D deficiency check labs as described in orders. continue present medications      Colonic polyp - reviewed past colonoscopy report message sent to Dr Foye Spurling to confirm timing of scope.    HM Physical next visit check labs as described in orders.      Discussed current controversies in prostate cancer screening, including conflicting approaches taken by national guidelines, and commentators.  Discussed my understanding of the existing data, and reviewed how I believe this data may be applied to his particular situation.   Based on this discussion the patient chooses to get tested       Orders and Meds     Orders Placed This Encounter   Procedures    Vitamin D    Basic metabolic panel    Lipid add Rfx to Drt LDL if Trig >400    Hemoglobin A1c    PSA (eff.11-2008)    CBC    Urinalysis with reflex to microscopic    Microalbumin, Urine, Random    [facility] DEPRESSION ASSESSMENT     Outpatient Encounter Prescriptions as of 12/27/2016   Medication Sig Dispense Refill    simvastatin (ZOCOR) 40 MG tablet TAKE 1 TABLET BY MOUTH EVERY DAY WITH DINNER 90 tablet 0    mometasone (NASONEX) 50 MCG/ACT nasal spray 2 sprays by Each Nare route daily   Shake liquid 17 g 5    albuterol HFA 108  (90 BASE) MCG/ACT inhaler Inhale 1-2 puffs into the lungs every 6 hours as needed for Wheezing   Shake well before each use. 3 Inhaler 1    budesonide (PULMICORT FLEXHALER) 180 MCG/ACT flexhaler Inhale 1 puff into the lungs 2 times daily 1 Inhaler 3    aspirin 81 MG tablet TAKE 1 TABLET DAILY.  0  Calcium Carbonate-Vitamin D (CALTRATE 600+D) 600-400 MG-UNIT per tablet TAKE 1 TABLET TWICE DAILY 60 5    [DISCONTINUED] fexofenadine (ALLEGRA) 180 MG tablet TAKE 1 TABLET DAILY. 30 5    cetirizine (ZYRTEC) 10 MG tablet Take 1 tablet (10 mg total) by mouth daily 30 tablet 5    [DISCONTINUED] lidocaine (LIDODERM) 5 % patch        No facility-administered encounter medications on file as of 12/27/2016.        Signed: Caroll Rancher, MD on 12/27/2016 at 1:10 PM

## 2016-12-27 NOTE — Patient Instructions (Signed)
Take a look into a "Netty pot" it is sold in pharmacies and is used to help clean out the sinuses.

## 2016-12-28 ENCOUNTER — Other Ambulatory Visit
Admission: RE | Admit: 2016-12-28 | Discharge: 2016-12-28 | Disposition: A | Discharge status: Home / Self Care | Payer: No Typology Code available for payment source | Source: Ambulatory Visit | Attending: Primary Care | Admitting: Primary Care

## 2016-12-28 DIAGNOSIS — E7800 Pure hypercholesterolemia, unspecified: Secondary | ICD-10-CM | POA: Insufficient documentation

## 2016-12-28 DIAGNOSIS — E559 Vitamin D deficiency, unspecified: Secondary | ICD-10-CM | POA: Insufficient documentation

## 2016-12-28 DIAGNOSIS — Z0000 Encounter for general adult medical examination without abnormal findings: Secondary | ICD-10-CM | POA: Insufficient documentation

## 2016-12-28 DIAGNOSIS — R7989 Other specified abnormal findings of blood chemistry: Secondary | ICD-10-CM | POA: Insufficient documentation

## 2016-12-28 DIAGNOSIS — Z125 Encounter for screening for malignant neoplasm of prostate: Secondary | ICD-10-CM | POA: Insufficient documentation

## 2016-12-28 LAB — BASIC METABOLIC PANEL
Anion Gap: 10 (ref 7–16)
CO2: 31 mmol/L — ABNORMAL HIGH (ref 20–28)
Calcium: 9.8 mg/dL (ref 8.6–10.2)
Chloride: 102 mmol/L (ref 96–108)
Creatinine: 1.22 mg/dL — ABNORMAL HIGH (ref 0.67–1.17)
GFR,Black: 77 *
GFR,Caucasian: 66 *
Glucose: 99 mg/dL (ref 60–99)
Lab: 12 mg/dL (ref 6–20)
Potassium: 4.7 mmol/L (ref 3.3–5.1)
Sodium: 143 mmol/L (ref 133–145)

## 2016-12-28 LAB — MICROALBUMIN, URINE, RANDOM
Creatinine,UR: 160 mg/dL (ref 20–300)
Microalbumin,UR: <0.3 mg/dL

## 2016-12-28 LAB — CBC
Hematocrit: 47 % (ref 40–51)
Hemoglobin: 15 g/dL (ref 13.7–17.5)
MCH: 28 pg/cell (ref 26–32)
MCHC: 32 g/dL (ref 32–37)
MCV: 88 fL (ref 79–92)
Platelets: 244 10*3/uL (ref 150–330)
RBC: 5.4 MIL/uL (ref 4.6–6.1)
RDW: 13.6 % (ref 11.6–14.4)
WBC: 6.7 10*3/uL (ref 4.2–9.1)

## 2016-12-28 LAB — URINALYSIS WITH REFLEX TO MICROSCOPIC
Blood,UA: NEGATIVE
Ketones, UA: NEGATIVE
Leuk Esterase,UA: NEGATIVE
Nitrite,UA: NEGATIVE
Protein,UA: NEGATIVE mg/dL
Specific Gravity,UA: 1.016 (ref 1.002–1.030)
pH,UA: 7 (ref 5.0–8.0)

## 2016-12-28 LAB — LIPID PANEL
Chol/HDL Ratio: 3.6
Cholesterol: 174 mg/dL
HDL: 49 mg/dL
LDL Calculated: 103 mg/dL
Non HDL Cholesterol: 125 mg/dL
Triglycerides: 112 mg/dL

## 2016-12-28 LAB — PSA (EFF.4-2010): PSA (eff. 4-2010): 1.36 ng/mL (ref 0.00–4.00)

## 2016-12-29 LAB — HEMOGLOBIN A1C: Hemoglobin A1C: 6 % (ref 4.0–6.0)

## 2016-12-29 LAB — STREP A CULTURE, THROAT: Group A Strep Throat Culture: 0

## 2017-01-01 LAB — VITAMIN D
25-OH VIT D2: <4 ng/mL
25-OH VIT D3: 28 ng/mL
25-OH Vit Total: 28 ng/mL — ABNORMAL LOW (ref 30–60)

## 2017-01-04 ENCOUNTER — Encounter: Payer: Self-pay | Admitting: Primary Care

## 2017-01-09 ENCOUNTER — Other Ambulatory Visit: Payer: Self-pay | Admitting: Primary Care

## 2017-01-09 MED ORDER — SIMVASTATIN 40 MG PO TABS *I*
40.0000 mg | ORAL_TABLET | Freq: Every day | ORAL | 0 refills | Status: DC
Start: 2017-01-09 — End: 2017-04-25

## 2017-01-09 NOTE — Addendum Note (Signed)
Addended by: Bernestine Amass on: 01/09/2017 02:03 PM     Modules accepted: Orders

## 2017-01-09 NOTE — Telephone Encounter (Signed)
He notes intermittent myalgias on simvastatin better if he takes breaks.  New issue didn't mention before.

## 2017-04-25 ENCOUNTER — Other Ambulatory Visit: Payer: Self-pay | Admitting: Primary Care

## 2017-05-22 ENCOUNTER — Encounter: Payer: Self-pay | Admitting: Primary Care

## 2017-06-29 ENCOUNTER — Ambulatory Visit: Payer: Commercial Managed Care - PPO | Attending: Primary Care | Admitting: Primary Care

## 2017-06-29 ENCOUNTER — Encounter: Payer: Self-pay | Admitting: Primary Care

## 2017-06-29 VITALS — BP 110/68 | HR 64 | Ht 61.0 in | Wt 196.0 lb

## 2017-06-29 DIAGNOSIS — E559 Vitamin D deficiency, unspecified: Secondary | ICD-10-CM

## 2017-06-29 DIAGNOSIS — J4540 Moderate persistent asthma, uncomplicated: Secondary | ICD-10-CM

## 2017-06-29 DIAGNOSIS — M2550 Pain in unspecified joint: Secondary | ICD-10-CM

## 2017-06-29 DIAGNOSIS — J3089 Other allergic rhinitis: Secondary | ICD-10-CM

## 2017-06-29 DIAGNOSIS — Z0000 Encounter for general adult medical examination without abnormal findings: Secondary | ICD-10-CM

## 2017-06-29 DIAGNOSIS — Z23 Encounter for immunization: Secondary | ICD-10-CM

## 2017-06-29 DIAGNOSIS — E7800 Pure hypercholesterolemia, unspecified: Secondary | ICD-10-CM

## 2017-06-29 MED ORDER — VITAMIN D 2000 UNIT PO TABS *I*
2000.0000 [IU] | ORAL_TABLET | Freq: Every day | ORAL | 3 refills | Status: DC
Start: 2017-06-29 — End: 2019-06-09

## 2017-06-29 NOTE — Progress Notes (Signed)
Flu vaccine given in left deltoid and Pneumovax 23 vaccine given in right deltoid per Dr. Marsa Aris, pt tolerated well.

## 2017-06-29 NOTE — Progress Notes (Signed)
Patient ID: Carl Olson is a 56 y.o. year old male who presents today forAnnual Exam and Arthritis (generalized aches and pains)    BP 110/68 (BP Location: Right arm, Patient Position: Sitting, Cuff Size: large adult)    Pulse 64    Ht 1.549 m (5\' 1" )    Wt 88.9 kg (196 lb)    BMI 37.03 kg/m   Estimated body mass index is 37.03 kg/m as calculated from the following:    Height as of this encounter: 1.549 m (5\' 1" ).    Weight as of this encounter: 88.9 kg (196 lb).    SUBJECTIVE        Patient presents for preventive health care visit.    Chronic knee back and hand pain continues these pains also shoulder pain, ankle, wrist and neck pain  Shoulder pain reported in the spring. He thinks these are benign aches and pains related to his work and are not bothering him much.      He has had a sore throat for the last few weeks and nagging cough that is gradually getting better.     He has had substernal chest pain for the last few weeks with the coughalso when he is taking deep breaths.   Most of these symptoms are better with the use of OTC robitussin.       Asthma with tirggers from occupational dust exposure given trial of zyrtec and nettie potting at our last visit.  He didn't find zyrtec effective so went back to fexofenadine.    He didn't try nettie pot.      He has a chronic rash      Medications, allergies, past medical history, social history reviewed and updated in electronic record and full ROS reviewed with patient Review of Systems -  Review of Systems   Constitutional: Negative for chills, fever, malaise/fatigue and weight loss.   HENT: Positive for congestion and sore throat. Negative for ear pain, hearing loss and tinnitus.    Eyes: Negative for blurred vision, double vision and photophobia.   Respiratory: Positive for wheezing. Negative for cough and shortness of breath.    Cardiovascular: Positive for chest pain. Negative for palpitations, leg swelling and PND.   Gastrointestinal: Negative for  abdominal pain, blood in stool, constipation, diarrhea, heartburn, nausea and vomiting.   Genitourinary: Negative for dysuria, frequency and urgency.   Musculoskeletal: Positive for back pain and joint pain. Negative for falls and myalgias.   Skin: Negative for itching and rash.   Neurological: Negative for dizziness, tingling, seizures and headaches.   Endo/Heme/Allergies: Positive for environmental allergies. Negative for polydipsia. Does not bruise/bleed easily.   Psychiatric/Behavioral: Negative for depression, hallucinations, memory loss and suicidal ideas.                  Allergy / Social History / Medications:     Allergies   Allergen Reactions    Moxifloxacin Other (See Comments)     Penile glans swelling     Family History   Problem Relation Age of Onset    High Blood Pressure Mother     Diabetes Mother     Dementia Mother     No Known Problems Father     High Blood Pressure Sister     High Blood Pressure Sister     Diabetes Sister     High Blood Pressure Sister     High Blood Pressure Sister     High Blood Pressure Sister  Past Medical History:   Diagnosis Date    Asthma     Bursitis of right patella 07/23/2013    Esophageal reflux 01/29/2006    Created by Conversion     Neuromuscular disorder     left shoulder surgery         Social History     Social History Narrative    Married, two step daughters.  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  No acute distress.  Appears stated age.  Skin: No rash.  No atypical nevi.    He has scatterecherry red hemangiomas   Scar mid upper back few stria at shoulders   Lymph nodes: No  cervical, submandibular, supraclavicular, axillary, inguinal lymphadenopathy.  HEENT: Pupils symmetric.  Conjunctiva pink.  Sclera anicteric.  Turbinates without edema or erythema.  Oropharynx moist with no lesions.  TMs are shiny .  Neck: No thyromegaly or thyroid nodules. No other masses, no crepitus. No carotid bruits.  No jugular venous distention.  Chest/Breast:   Recumbent exams.  No mass or tenderness.  Skin without dimpling or retraction.  No nipple discharge.  Lungs: Clear to auscultation bilaterally, normal respiratory effort, no wheeze or rales   Cardiac:  Regular S1 and S2 with no murmur, rub, gallop.  PMI nondisplaced.  Abdomen: Soft, non tender, non distended.  No palpable HSM or mass.  Normal active bowel sounds.  Genitals: Testes descended bilaterally, no palpable mass or tenderness.  Foreskin is not able to be retracted he says this is chronic.     Rectal/prostate: Prostate is non tender.  Prostate is not enlarged.  No palpable nodule.  No rectal mass.   Marland Kitchen   Spine/musculoskeletal: Spine is non tender.  No joint deformities .  Good functional range of motion  Extremities: No clubbing, cyanosis, edema.  Vascular: 2+ radial, brachial, femoral, dorsalis pedis pulses bilaterally.  Neurologic: Patient is alert and oriented.  Sensation intact to light touch  Gait is without ataxia.    Psych:  Appropriate historian , normal affect and interaction, good eye contact.       Recent Labs and Other Results     Lab Results   Component Value Date    NA 143 12/28/2016    K 4.7 12/28/2016    CL 102 12/28/2016    CO2 31 (H) 12/28/2016    UN 12 12/28/2016    CREAT 1.22 (H) 12/28/2016    VID25 28 (L) 12/28/2016    WBC 6.7 12/28/2016    HGB 15.0 12/28/2016    HCT 47 12/28/2016    PLT 244 12/28/2016    TSH 1.62 01/22/2016    CHOL 174 12/28/2016    TRIG 112 12/28/2016    HDL 49 12/28/2016    LDLC 103 12/28/2016    Sneads 3.6 12/28/2016     Hemoglobin A1C   Date Value Ref Range Status   12/28/2016  6.0 4.0 - 6.0 % Final      Comment:                                   NON-DIABETIC         EKG sinus axis positive rate 72 no ischemic changes borderline first degree AV block      ASSESSMENT AND PLAN     1. Health care maintenance  Basic metabolic panel    Lipid add Rfx to Drt LDL if Trig >400    PSA (eff.11-2008)    Vitamin D   2. Moderate persistent asthma without complication  Pneumococcal polysaccharide vaccine 23-valent greater than or equal to 2yo subcutaneous/IM   3. High cholesterol     4. Vitamin D deficiency  cholecalciferol (VITAMIN D) 2000 units tablet    Vitamin D   5. Allergic rhinitis due to other allergic trigger, unspecified seasonality     6. Arthralgia, unspecified joint     7. Need for vaccination  Flu vaccine quadrivalent greater than or equal to 45mo preservative free IM    Pneumococcal polysaccharide vaccine 23-valent greater than or equal to 2yo subcutaneous/IM      Health Maintenance  Preventive health guidelines reviewed with patient and highlighted particular issues  pertinent to their health, gender, and age.    Discussed preventive lab testing and immunizations. See discussion below and orders for particular items discussed at today's visit.      Items discussed include (but are not limited only to) those indicated below     Health care proxy / advance directives    Done during visit.    Lifestyle modification including diet and exercise as appropriate to this patient    Skin CA awareness/prevention discussed  --Sunscreen use / long sleeved clothing  --Monitor moles for the following changes:    A: asymmetry   B: border changes   C: color changes    D: diameter greater than a pencil eraser size    E: evolving, or changing mole       Seat belt use counseled      Gun safe storage review offered     Dentistry yearly advised    [place] every 1-2 years advised        Reviewed blood work on file / discussed additional blood tests    Hepatitis C screening for those born between [place] offered    EKG for those with  HTN/significant CVD risk factors or age > 39     Colon CA screening starting at 7  - due next year.          Prostate cancer screening did PSA in the spring would continue.     Testicular self exam discussed           Vaccinations:  Immunization History   Administered Date(s) Administered    Influenza Adult(71yr and up) 06/05/2011    Influenza Quadrivalent 0.87mL prefilled syringe/single dose vial(FluLaval,Fluzone,Afluria,Fluarix) 06/28/2012, 07/20/2015, 07/13/2016, 06/29/2017    Influenza Whole 07/08/2007, 05/04/2008, 05/06/2009    Influenza multi-dose vial 06/12/2013    Pneumococcal Polysaccharide (Pneumovax) 06/29/2017    Tdap 03/02/2008       Yearly flu shot advised     TDAP for patients less than 65 or with exposure to young children due for TD next year      Pneumovax at age 23 or if specific risk factors -  CV Risk stratification  Reviewed 2013 AHA cholesterol management guidelines with patient and discussed on my review how I think they apply to this patients care.   Lab Results   Component Value Date    CHOL 174 12/28/2016    HDL 49 12/28/2016      Risk calculation tool done and discussed with patient. *   The 10-year ASCVD risk score Mikey Bussing DC Jr., et al., 2013) is: 4.9%*    Values used to calculate the score:      Age: 24 years      Sex: Male      Is Non-Hispanic African American: Yes      Diabetic: No      Tobacco smoker: No      Systolic Blood Pressure: 093 mmHg      Is BP treated: No      HDL Cholesterol: 49 mg/dL*      Total Cholesterol: 174 mg/dL*      * - Cholesterol units were assumed for this score calculation            Asthma - not taking full planned regimen. Asked to start.  Chest discomfort sounds more related to asthma then anything else.         CHOL taking simvastatin which is not optimal medication reevaluate in the future.       Vitamin D deficiency taking low dose OTC vitamin D regularly.    Asked to increase to 2000 IU daily      Allergic rhinitis encouraged use of Nettiy pot.       Colonc polyps due for scope sometime next year.         Orders and Meds     Orders Placed This Encounter   Procedures    Flu vaccine quadrivalent greater than or equal to 19mo preservative free IM    Pneumococcal polysaccharide vaccine 23-valent greater than or equal to 2yo subcutaneous/IM    Basic metabolic panel    Lipid add Rfx to Drt LDL if Trig >400    PSA (eff.11-2008)    Vitamin D     Outpatient Encounter Prescriptions as of 06/29/2017   Medication Sig Dispense Refill    fexofenadine (ALLEGRA) 180 MG tablet Take 180 mg by mouth daily      simvastatin (ZOCOR) 40 MG tablet TAKE 1 TABLET(40 MG) BY MOUTH DAILY WITH DINNER 90 tablet 0    mometasone (NASONEX) 50 MCG/ACT nasal spray 2 sprays by Each Nare route daily   Shake liquid 17 g 5    albuterol HFA 108 (90 BASE) MCG/ACT inhaler Inhale 1-2 puffs into the lungs every 6 hours as needed for Wheezing   Shake well before each use. 3 Inhaler 1    budesonide (PULMICORT FLEXHALER) 180 MCG/ACT flexhaler Inhale 1 puff into the lungs 2 times daily (Patient taking differently: Inhale 1 puff into the lungs 2 times daily   taking it once a day) 1 Inhaler 3    aspirin 81 MG tablet TAKE 1 TABLET DAILY.  0    Calcium Carbonate-Vitamin D (CALTRATE 600+D) 600-400 MG-UNIT per tablet TAKE 1 TABLET TWICE DAILY (Patient taking differently: Take 1 tablet by mouth daily   ) 60 5    cholecalciferol (VITAMIN D) 2000 units tablet Take 1 tablet (2,000 Units total) by mouth daily 100 capsule 3     No facility-administered encounter medications on file as of 06/29/2017.        Signed: Caroll Rancher, MD on 06/29/2017  at 4:49 PM

## 2017-06-29 NOTE — Patient Instructions (Addendum)
Take a look into a "Netty pot" it is sold in pharmacies and is used to help clean out the sinuses I think this can help your allergies and asthma.     Use the Pulmicort twice a day when you brush your teeth.

## 2017-07-19 ENCOUNTER — Telehealth: Payer: Self-pay | Admitting: Primary Care

## 2017-07-19 MED ORDER — BUDESONIDE 180 MCG/ACT IN AEPB *A*
1.0000 | INHALATION_SPRAY | Freq: Two times a day (BID) | RESPIRATORY_TRACT | 3 refills | Status: DC
Start: 2017-07-19 — End: 2018-09-12

## 2017-07-19 MED ORDER — MOMETASONE FUROATE 50 MCG/ACT NA SUSP *I*
2.0000 | Freq: Every day | NASAL | 5 refills | Status: DC
Start: 2017-07-19 — End: 2018-06-14

## 2017-07-19 NOTE — Telephone Encounter (Signed)
Wife called send to [place]

## 2017-09-27 ENCOUNTER — Telehealth: Payer: Self-pay | Admitting: Primary Care

## 2017-09-27 MED ORDER — SIMVASTATIN 40 MG PO TABS *I*
40.0000 mg | ORAL_TABLET | Freq: Every day | ORAL | 1 refills | Status: DC
Start: 2017-09-27 — End: 2018-05-07

## 2017-09-27 NOTE — Telephone Encounter (Signed)
Refill

## 2017-12-21 ENCOUNTER — Other Ambulatory Visit: Payer: Self-pay | Admitting: Primary Care

## 2017-12-21 MED ORDER — ALBUTEROL SULFATE HFA 108 (90 BASE) MCG/ACT IN AERS *I*
1.0000 | INHALATION_SPRAY | Freq: Four times a day (QID) | RESPIRATORY_TRACT | 2 refills | Status: DC | PRN
Start: 2017-12-21 — End: 2019-12-03

## 2017-12-21 NOTE — Telephone Encounter (Signed)
Additional message:    Last office visit at [facility]:   06/29/2017  Patients upcoming appointments:  Future Appointments  Date Time Provider [place]   07/05/2018 9:00 AM Caroll Rancher, MD [facility] None     Recent Lab results:  GENERAL CHEMISTRY   Recent Labs      12/28/16   0951   NA  143   K  4.7   CL  102   CO2  31*   GAP  10   UN  12   CREAT  1.22*   GFRC  66   GFRB  77   GLU  99   CA  9.8      LIPID PROFILE   Recent Labs      12/28/16   0951   CHOL  174   TRIG  112   HDL  49   LDLC  103   NHDLC  125   CHHDC  3.6      LIVER PROFILE   No value within the past 365 days   DIABETES THYROID   Recent Labs      12/28/16   0951   HA1C  6.0    No value within the past 365 days      Pending/Orders Labs:  Lab Frequency Next Occurrence   Basic metabolic panel Once 27/10/5007   Lipid add Rfx to Drt LDL if Trig >400 Once 06/29/2018   PSA (eff.11-2008) Once 06/29/2018   Vitamin D Once 06/29/2018

## 2018-01-03 ENCOUNTER — Ambulatory Visit: Payer: Commercial Managed Care - PPO | Attending: Primary Care | Admitting: Primary Care

## 2018-01-03 ENCOUNTER — Encounter: Payer: Self-pay | Admitting: Primary Care

## 2018-01-03 VITALS — BP 112/72 | HR 68 | Temp 98.3°F | Ht 61.5 in | Wt 195.0 lb

## 2018-01-03 DIAGNOSIS — J3089 Other allergic rhinitis: Secondary | ICD-10-CM

## 2018-01-03 DIAGNOSIS — J4541 Moderate persistent asthma with (acute) exacerbation: Secondary | ICD-10-CM

## 2018-01-03 DIAGNOSIS — D126 Benign neoplasm of colon, unspecified: Secondary | ICD-10-CM

## 2018-01-03 MED ORDER — MONTELUKAST SODIUM 10 MG PO TABS *I*
10.0000 mg | ORAL_TABLET | Freq: Every evening | ORAL | 5 refills | Status: DC | PRN
Start: 2018-01-03 — End: 2018-07-05

## 2018-01-03 MED ORDER — PREDNISONE 10 MG PO TABS *I*
ORAL_TABLET | ORAL | 0 refills | Status: AC
Start: 2018-01-03 — End: 2018-01-15

## 2018-01-03 NOTE — Patient Instructions (Signed)
Take a look into a "Netty pot" it is sold in pharmacies and is used to help clean out the sinuses.

## 2018-01-03 NOTE — NoShare Progress Note (Signed)
Patient ID: Carl Olson is a 57 y.o. year old male who presents today forURI (cough, ears "itchy"; chest hurts from coughing, sore throat, taking fexofenadine)    BP 112/72 (BP Location: Right arm, Patient Position: Sitting, Cuff Size: large adult)    Pulse 68    Temp 36.8 C (98.3 F) (Oral)    Ht 1.562 m (5' 1.5")    Wt 88.5 kg (195 lb)    BMI 36.25 kg/m   Estimated body mass index is 36.25 kg/m as calculated from the following:    Height as of this encounter: 1.562 m (5' 1.5").    Weight as of this encounter: 88.5 kg (195 lb).    SUBJECTIVE        57 year old with history of high cholesterol,  asthma, reflux, allergic rhinitis    Seen for acute visit for URI type symptoms cough, ears itchy, chest discomfort, sort throat, runny nose, sneezing.   He is using Nasonex and fexofenadine.        Historically he has had problems with dust exposure on work but last year in May had a flare of asthma/allergies with similar symptoms he was switched to zyrtec and was  asked to use Neti Pot but didn't.    His symptoms resolved.    He reports adherence to his asthma meds uses albuterol PRN and is using more recently.     ROS no fevers no chills some chest discomfort, no wheezing.         Medications and problem list reviewed   during visit     Allergy / Social History / Medications:     Allergies   Allergen Reactions    Moxifloxacin Other (See Comments)     Penile glans swelling     Family History   Problem Relation Age of Onset    High Blood Pressure Mother     Diabetes Mother     Dementia Mother     No Known Problems Father     High Blood Pressure Sister     High Blood Pressure Sister     Diabetes Sister     High Blood Pressure Sister     High Blood Pressure Sister     High Blood Pressure Sister      Past Medical History:   Diagnosis Date    Asthma     Bursitis of right patella 07/23/2013    Esophageal reflux 01/29/2006    Created by Conversion     Neuromuscular disorder     left shoulder surgery          Social History     Social History Narrative    Married, two step daughters.  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  Rare cough non productive during visit he has diffuse wheezing.  ABD:    Normal bowel sounds, Abdomen soft, No guarding or rebound.  No HSM.   Ext: no edema  Psych:  Good historian, appropriate eye contact, normal affect      Recent Labs and Other Results     Lab Results   Component Value Date    NA 143 12/28/2016    K 4.7 12/28/2016    CL 102 12/28/2016    CO2 31 (H) 12/28/2016    UN 12 12/28/2016    CREAT 1.22 (H) 12/28/2016    VID25 28 (L) 12/28/2016    WBC 6.7 12/28/2016    HGB 15.0 12/28/2016    HCT 47 12/28/2016    PLT 244 12/28/2016    TSH 1.62 01/22/2016    CHOL 174 12/28/2016    TRIG 112 12/28/2016    HDL 49 12/28/2016    LDLC 103 12/28/2016    CHHDC 3.6 12/28/2016     Hemoglobin A1C   Date Value Ref Range Status   12/28/2016 6.0 4.0 - 6.0 % Final     Comment:                                   NON-DIABETIC             ASSESSMENT AND PLAN     1. Moderate persistent asthma with acute exacerbation  predniSONE (DELTASONE) 10 MG tablet    montelukast (SINGULAIR) 10 MG tablet   2. Seasonal allergic rhinitis due to other allergic trigger  montelukast (SINGULAIR) 10 MG tablet   3. Adenomatous polyp of colon, unspecified part of colon  AMB REFERRAL TO GASTROENTEROLOGY      Asthma with acute flare steroids and montelukast  (latter is PRN)  Call if no improvement or getting worse    Allergies likely trigger for asthma flare - discussed pollen avoidance techniques including bathing at night, neti potting.    Colon polyps due for colonoscopy     Orders and Meds     Orders Placed This Encounter   Procedures    AMB REFERRAL TO GASTROENTEROLOGY     Outpatient Encounter Prescriptions as of 01/03/2018   Medication Sig Dispense Refill    albuterol HFA 108 (90 Base) MCG/ACT inhaler Inhale 1-2 puffs into the lungs every 6 hours as needed for Wheezing   Shake well before each use. 3 Inhaler 2    simvastatin (ZOCOR) 40 MG tablet Take 1 tablet (40 mg total) by mouth daily (with dinner) 90 tablet 1    budesonide (PULMICORT FLEXHALER) 180 MCG/ACT flexhaler Inhale 1 puff into the lungs 2 times daily 1 Inhaler 3    mometasone (NASONEX) 50 MCG/ACT nasal spray 2 sprays by Each Nare route daily   Shake liquid 17 g 5    fexofenadine (ALLEGRA) 180 MG tablet Take 180 mg by mouth daily      cholecalciferol (VITAMIN D) 2000 units tablet Take 1 tablet (2,000 Units total) by mouth daily 100 capsule 3    aspirin 81 MG tablet TAKE 1 TABLET DAILY.  0    Calcium Carbonate-Vitamin D (CALTRATE 600+D) 600-400 MG-UNIT per tablet TAKE 1 TABLET TWICE DAILY (Patient taking differently: Take 1 tablet by mouth daily   ) 60 5    predniSONE (DELTASONE) 10 MG tablet  4 tabs for three days, 3 tabs for three days, 2 tabs for three days, 1 tab for three days. 30 tablet 0    montelukast (SINGULAIR) 10 MG tablet Take 1 tablet (10 mg total) by mouth nightly as needed (asthma and allergies) 30 tablet 5     No facility-administered encounter medications on file as of 01/03/2018.        Signed: Caroll Rancher, MD on 01/03/2018 at 10:06 AM

## 2018-01-15 ENCOUNTER — Telehealth: Payer: Self-pay | Admitting: Gastroenterology

## 2018-01-15 NOTE — Telephone Encounter (Signed)
Mr. Bartmess is scheduled for a colonoscopy on 11/15 with Dr. Scherrie Bateman.  Please mail prep instructions to the address listed on file.   The patient can be reached if necessary at [phone].    1.  Is the patient taking any prescribed blood thinners? no If yes, prescriber is N    2.  Is the patient on any type of Kidney Dialysis? no    3.  Does the patient have any type of cardiac device or a defibrillator? no    4.  Is the patients weight greater than 350lbs? no    5. Does the patient have a current tracheotomy? no     6.  Does the patient use a machine to help them breathe at night? no    7.  Is the patient Diabetic? no    8.  Is the patient able to sign the consent form?  yes     If no, who should we contact to have the consent signed?  at phone # .

## 2018-01-18 NOTE — Telephone Encounter (Signed)
Confirmation letter with instructions mailed for 06/28/18 [phone]

## 2018-05-07 ENCOUNTER — Other Ambulatory Visit: Payer: Self-pay | Admitting: Primary Care

## 2018-05-07 NOTE — Telephone Encounter (Signed)
Additional message:    Last office visit at [facility]:   01/03/2018  Patients upcoming appointments:  Future Appointments  Date Time Provider [place]   06/28/2018 8:30 AM Harlin Rain, MD [facility] None   07/05/2018 9:00 AM Caroll Rancher, MD [facility] None     Recent Lab results:  GENERAL CHEMISTRY   No value within the past 365 days   LIPID PROFILE   No value within the past 365 days   LIVER PROFILE   No value within the past 365 days   DIABETES THYROID   No value within the past 365 days No value within the past 365 days      Pending/Orders Labs:  Lab Frequency Next Occurrence   Basic metabolic panel Once 08/06/8249   Lipid add Rfx to Drt LDL if Trig >400 Once 06/29/2018   PSA (eff.11-2008) Once 06/29/2018   Vitamin D Once 06/29/2018

## 2018-06-08 ENCOUNTER — Other Ambulatory Visit
Admission: RE | Admit: 2018-06-08 | Discharge: 2018-06-08 | Disposition: A | Discharge status: Home / Self Care | Payer: Commercial Managed Care - PPO | Source: Ambulatory Visit | Attending: Primary Care | Admitting: Primary Care

## 2018-06-08 DIAGNOSIS — Z0000 Encounter for general adult medical examination without abnormal findings: Secondary | ICD-10-CM

## 2018-06-08 DIAGNOSIS — E559 Vitamin D deficiency, unspecified: Secondary | ICD-10-CM | POA: Insufficient documentation

## 2018-06-08 LAB — LIPID PANEL
Chol/HDL Ratio: 3.5
Cholesterol: 189 mg/dL
HDL: 54 mg/dL (ref 40–60)
LDL Calculated: 113 mg/dL
Non HDL Cholesterol: 135 mg/dL
Triglycerides: 108 mg/dL

## 2018-06-08 LAB — BASIC METABOLIC PANEL
Anion Gap: 14 (ref 7–16)
CO2: 27 mmol/L (ref 20–28)
Calcium: 9.7 mg/dL (ref 8.6–10.2)
Chloride: 102 mmol/L (ref 96–108)
Creatinine: 1.22 mg/dL — ABNORMAL HIGH (ref 0.67–1.17)
GFR,Black: 76 *
GFR,Caucasian: 66 *
Glucose: 100 mg/dL — ABNORMAL HIGH (ref 60–99)
Lab: 11 mg/dL (ref 6–20)
Potassium: 4.7 mmol/L (ref 3.3–5.1)
Sodium: 143 mmol/L (ref 133–145)

## 2018-06-08 LAB — PSA (EFF.4-2010): PSA (eff. 4-2010): 1.82 ng/mL (ref 0.00–4.00)

## 2018-06-08 LAB — VITAMIN D: 25-OH Vit Total: 28 ng/mL — ABNORMAL LOW (ref 30–60)

## 2018-06-14 ENCOUNTER — Other Ambulatory Visit: Payer: Self-pay | Admitting: Primary Care

## 2018-06-14 DIAGNOSIS — K922 Gastrointestinal hemorrhage, unspecified: Secondary | ICD-10-CM

## 2018-06-14 HISTORY — DX: Gastrointestinal hemorrhage, unspecified: K92.2

## 2018-06-17 NOTE — Telephone Encounter (Signed)
Additional message:    Last office visit at [facility]:   Visit date not found  Patients upcoming appointments:  Future Appointments   Date Time Provider [place]   06/28/2018  8:30 AM Harlin Rain, MD [facility] None   07/05/2018  9:00 AM Caroll Rancher, MD [facility] None     Recent Lab results:  GENERAL CHEMISTRY   Recent Labs     06/08/18  0849   NA 143   K 4.7   CL 102   CO2 27   GAP 14   UN 11   CREAT 1.22*   GFRC 66   GFRB 76   GLU 100*   CA 9.7      LIPID PROFILE   Recent Labs     06/08/18  0849   CHOL 189   TRIG 108   HDL 54   LDLC 113   NHDLC 135   CHHDC 3.5      LIVER PROFILE   No value within the past 365 days   DIABETES THYROID   No value within the past 365 days No value within the past 365 days      Pending/Orders Labs:  Lab Frequency Next Occurrence

## 2018-06-21 ENCOUNTER — Telehealth: Payer: Self-pay

## 2018-06-21 NOTE — Telephone Encounter (Signed)
[place] 06/28/18 appt arriving 0745 at [facility] with spouse Jolayne Haines, no questions on prep, aware of need for driver.

## 2018-06-28 ENCOUNTER — Other Ambulatory Visit: Payer: Commercial Managed Care - PPO | Admitting: Gastroenterology

## 2018-06-28 ENCOUNTER — Encounter: Payer: Self-pay | Admitting: Liver Pancreas GI Surgery

## 2018-06-28 ENCOUNTER — Ambulatory Visit
Admission: RE | Admit: 2018-06-28 | Discharge: 2018-06-28 | Disposition: A | Discharge status: Home / Self Care | Payer: Commercial Managed Care - PPO | Source: Ambulatory Visit | Attending: Liver Pancreas GI Surgery | Admitting: Liver Pancreas GI Surgery

## 2018-06-28 DIAGNOSIS — K5730 Diverticulosis of large intestine without perforation or abscess without bleeding: Secondary | ICD-10-CM | POA: Insufficient documentation

## 2018-06-28 DIAGNOSIS — Z1211 Encounter for screening for malignant neoplasm of colon: Secondary | ICD-10-CM | POA: Insufficient documentation

## 2018-06-28 DIAGNOSIS — K648 Other hemorrhoids: Secondary | ICD-10-CM | POA: Insufficient documentation

## 2018-06-28 DIAGNOSIS — D122 Benign neoplasm of ascending colon: Secondary | ICD-10-CM

## 2018-06-28 DIAGNOSIS — D123 Benign neoplasm of transverse colon: Secondary | ICD-10-CM | POA: Insufficient documentation

## 2018-06-28 DIAGNOSIS — Z86010 Personal history of colonic polyps: Secondary | ICD-10-CM | POA: Insufficient documentation

## 2018-06-28 DIAGNOSIS — D120 Benign neoplasm of cecum: Secondary | ICD-10-CM

## 2018-06-28 HISTORY — PX: ENDOSCOPY, COLON, WITH POLYPECTOMY 45385: GI8

## 2018-06-28 LAB — HM COLONOSCOPY

## 2018-06-28 MED ORDER — NALOXONE HCL 0.4 MG/ML IJ SOLN *WRAPPED*
Status: AC
Start: 2018-06-28 — End: 2018-06-28
  Filled 2018-06-28: qty 1

## 2018-06-28 MED ORDER — FLUMAZENIL 0.1 MG/ML IV SOLN *WRAPPED*
INTRAVENOUS | Status: AC
Start: 2018-06-28 — End: 2018-06-28
  Filled 2018-06-28: qty 5

## 2018-06-28 MED ORDER — EPINEPHRINE HCL 0.1 MG/ML IJ SOLUTION *WRAPPED*
INTRAMUSCULAR | Status: AC
Start: 2018-06-28 — End: 2018-06-28
  Filled 2018-06-28: qty 10

## 2018-06-28 MED ORDER — LACTATED RINGERS IV SOLN *I*
100.0000 mL/h | INTRAVENOUS | Status: DC
Start: 2018-06-28 — End: 2018-06-29
  Administered 2018-06-28: 100 mL/h via INTRAVENOUS

## 2018-06-28 MED ORDER — MIDAZOLAM HCL 5 MG/5ML IJ SOLN *I*
INTRAMUSCULAR | Status: AC
Start: 2018-06-28 — End: 2018-06-28
  Filled 2018-06-28: qty 10

## 2018-06-28 MED ORDER — MIDAZOLAM HCL 1 MG/ML IJ SOLN *I* WRAPPED
INTRAMUSCULAR | Status: AC | PRN
Start: 2018-06-28 — End: 2018-06-28
  Administered 2018-06-28 (×2): 2 mg via INTRAVENOUS

## 2018-06-28 MED ORDER — FENTANYL CITRATE 50 MCG/ML IJ SOLN *WRAPPED*
INTRAMUSCULAR | Status: AC | PRN
Start: 2018-06-28 — End: 2018-06-28
  Administered 2018-06-28: 25 ug via INTRAVENOUS
  Administered 2018-06-28: 50 ug via INTRAVENOUS

## 2018-06-28 MED ORDER — FENTANYL CITRATE 50 MCG/ML IJ SOLN *WRAPPED*
INTRAMUSCULAR | Status: AC
Start: 2018-06-28 — End: 2018-06-28
  Filled 2018-06-28: qty 4

## 2018-06-28 MED ORDER — ATROPINE SULFATE 0.1 MG/ML IJ SOLN *I*
INTRAMUSCULAR | Status: AC
Start: 2018-06-28 — End: 2018-06-28
  Filled 2018-06-28: qty 10

## 2018-06-28 NOTE — Procedures (Addendum)
Procedure Report  Colonoscopy Procedure Note   Date of Procedure: 06/28/2018   Referring Physician: Caroll Rancher, MD  Primary Physician: Caroll Rancher, MD        Attending Physician: Harlin Rain, MD  Fellow:   Fran Lowes, MD  Indications:  Surveillance for history of colonic polyps  Previous colonoscopy: Yes -- Date: 2014  Medications: Fentanyl 75 mcg IV and Midazolam 4 mg IV were administered incrementally over the course of the procedure to achieve an adequate level of conscious sedation.    Moderate Sedation Face Times  Start Time: 0907  End Time: 0939  Duration (minutes): 32 Minutes        Scopes: [id number]    Procedure Details: Full disclosures of risks were reviewed with patient as detailed on the consent form. The patient was placed in the left lateral decubitus position and monitored with continuous pulse oximetry, interval blood pressure monitoring and direct observations.   After anorectal examination was performed, the colonoscope was inserted into the rectum and advanced under direct vision to the terminal ileum and cecum, which was identified by  the ileocecal valve and the appendiceal orifice. The procedure was considered not difficult.  During withdrawal examination, the final quality of the prep was [facility] Bowel Prep Scale:  Right Colon: Grade3- (entire mucosa of colon segment seen well, with no residual staining, small fragments of stool, or opaque liquid)  Transverse Colon: Grade 3- (entire mucosa of colon segment seen well, with no residual staining, small fragments of stool, or opaque liquid)  Left Colon: Grade 3- (entire mucosa of colon segment seen well, with no residual staining, small fragments of stool, or opaque liquid)  A careful inspection was made as the colonoscope was withdrawn. A retroflexed view of the rectum was performed; findings and interventions are described below. The patient was recovered in the GI recovery area.     Scope Times:     Insertion Time:  0913  Cecum Time: 0923  Exit Time: 0937    Findings:    Anorectal:   Normal tone, no masses  Terminal Ileum:   Normal ileal mucosa  Cecum:   1 polyp(s) 4 mm in size,  sessile, removed by cold snare and retrieved and sent to pathology  Ascending Colon:   1 polyp(s) 5 mm in size,  sessile, removed by cold snare and retrieved and sent to pathology   1 polyp(s) 6 mm in size,  sessile, removed by cold snare and retrieved and sent to pathology  Transverse Colon:   2 polyp(s) 3 mm in size,  sessile, removed by cold snare and retrieved and sent to pathology  Descending Colon:   Diverticulosis: mild   No polyps or mass.  Sigmoid:   Diverticulosis: mild    No polyps or mass.    Rectum:   Internal hemorrhoids, small    No polyps or mass.      Intervention(s):      5 polyp(s) removed by cold snare biopsy      Complication(s):     none    EBL: <5 ml    Impression(s):     Multiple (5) polyps removed as above.   Diverticulosis, located in the descending and sigmoid colon.   Internal hemorrhoids.   Otherwise normal colonoscopy to the terminal ileum.    Recommendation(s):     Await pathology.   Repeat colonoscopy in 3 years.   Follow up with primary care physician.    Histopathologic Diagnosis: Adenomas confirmed, repeat  exam recommended in 3 years.    FINAL DIAGNOSIS:   (A) Cecum, polyp, biopsy:     - Tubular adenoma.   (B) Colon, right, polyps, biopsy:     - Tubular adenomas.   (C) Colon, transverse, polyps, biopsy,     - Tubular adenomas.    Kristen Cardinal, MD     GI ATTENDING    I was present for the entire viewing portion of the exam and participated as needed throughout the procedure, and I concur with the findings and intervention descriptions as outlined in this edited note. I also ordered and supervised the moderate sedation.    Harlin Rain, MD

## 2018-06-28 NOTE — Preop H&P (Signed)
OUTPATIENT PRE-PROCEDURE H&P    Chief Complaint / Indications for Procedure: CRCS, hx of colonic polyps    Past Medical History:     Past Medical History:   Diagnosis Date    Asthma     Bursitis of right patella 07/23/2013    Esophageal reflux 01/29/2006    Created by Conversion     Neuromuscular disorder     left shoulder surgery     Past Surgical History:   Procedure Laterality Date    LIPOMA RESECTION      SHOULDER SURGERY Right      Family History   Problem Relation Age of Onset    High Blood Pressure Mother     Diabetes Mother     Dementia Mother     No Known Problems Father     High Blood Pressure Sister     High Blood Pressure Sister     Diabetes Sister     High Blood Pressure Sister     High Blood Pressure Sister     High Blood Pressure Sister      Social History     Socioeconomic History    Marital status: Married     Spouse name: Not on file    Number of children: Not on file    Years of education: Not on file    Highest education level: Not on file   Tobacco Use    Smoking status: Never Smoker    Smokeless tobacco: Never Used   Substance and Sexual Activity    Alcohol use: Yes     Alcohol/week: 1.0 standard drinks     Types: 1 Cans of beer per week    Drug use: No    Sexual activity: Yes     Partners: Female   Other Topics Concern    Not on file   Social History Narrative    Married, two step daughters.  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  Animator for [employer]         Allergies:    Allergies   Allergen Reactions    Moxifloxacin Other (See Comments)     Penile glans swelling       Medications:  (Not in a hospital admission)      Current Outpatient Medications   Medication    mometasone (NASONEX) 50 MCG/ACT nasal spray    simvastatin (ZOCOR) 40 MG tablet    montelukast (SINGULAIR) 10 MG tablet    albuterol HFA 108 (90 Base) MCG/ACT inhaler    budesonide (PULMICORT FLEXHALER) 180 MCG/ACT flexhaler    fexofenadine (ALLEGRA) 180 MG tablet    cholecalciferol (VITAMIN  D) 2000 units tablet    aspirin 81 MG tablet    Calcium Carbonate-Vitamin D (CALTRATE 600+D) 600-400 MG-UNIT per tablet     Current Facility-Administered Medications   Medication Dose Route Frequency    Lactated Ringers Infusion  100 mL/hr Intravenous Continuous     There were no vitals filed for this visit.    ROS    Physical Examination:  Head/Nose/Throat:negative  Lungs:Lungs clear  Cardiovascular:normal S1 and S2  Abdomen: abdomen soft, non-tender, nondistended, normal active bowel sounds, no masses or organomegaly      Lab Results: All labs in the last 24 hours No results found for this or any previous visit (from the past 24 hour(s)).    Radiology impressions (last 30 days):  No results found.    Currently Active Problems:  Patient Active Problem List   Diagnosis Code  Allergic rhinitis J30.9    Asthma J45.909    Arthralgia M25.50    High cholesterol E78.00    Vitamin D deficiency E55.9    Health care maintenance Z00.00    Colonic polyp tubular adenoma next scope due 2019 K63.5        Impression:  Screening colonoscopy    Plan:  Colonoscopy  Moderate Sedation    UPDATES TO PATIENT'S CONDITION on the DAY OF SURGERY/PROCEDURE    I. Updates to Patient's Condition (to be completed by a provider privileged to complete a H&P, following reassessment of the patient by the provider):    Full H&P done today; no updates needed.    II. Procedure Readiness   I have reviewed the patient's H&P and updated condition. By completing and signing this form, I attest that this patient is ready for surgery/procedure.      III. Attestation   I have reviewed the updated information regarding the patient's condition and it is appropriate to proceed with the planned surgery/procedure.    Kristen Cardinal, MD as of 8:05 AM 06/28/2018

## 2018-06-28 NOTE — Discharge Instructions (Signed)
Gastroenterology Unit  Discharge Instructions for Colonoscopy      06/28/2018    9:38 AM    Colonoscopy and Polyp(s) Removed    Do not drive, operate heavy machinery, drink alcoholic beverages, make important personal or business decisions, or sign legal documents until the next day.      Return to your usual diet    Things you may expect:   A small amount of bright red blood in your stool   It may be a few days before you have a bowel movement   You may have cramping, bloating, and feelings of "gas". These feelings should go away as you pass gas. If you still feel uncomfortable, walking around will help to pass the gas.   You were given medication to help you relax during the test. You may feel "fuzzy" and drowsy. Go home and rest for at least 4-6 hours.    You should call your doctor for any of the following:   Bad stomach pain   Fever   Bright red bleeding or clots (This may happen up to 20 days after the test.)   Dizziness or weakness that gets worse or lasts up to 24 hours.   Pain or redness at the IV site    If you have a serious problem after hours, Call [phone] to reach the GI physician on call. If you are unable to reach your doctor, go to the [facility] Emergency Department.    Follow Up Care:   If biopsies were taken during your procedure, we will send you the pathology results within 7-10 days. If you do not receive your pathology results after 10 days please call [phone]   Report will be sent to your primary doctor.   Repeat exam in  3 year(s)    New Prescriptions    No medications on file

## 2018-06-29 ENCOUNTER — Observation Stay
Admission: EM | Admit: 2018-06-29 | Discharge: 2018-06-30 | Disposition: A | Discharge status: Home / Self Care | Payer: Commercial Managed Care - PPO | Source: Ambulatory Visit | Attending: Emergency Medicine | Admitting: Emergency Medicine

## 2018-06-29 ENCOUNTER — Emergency Department: Payer: Commercial Managed Care - PPO

## 2018-06-29 DIAGNOSIS — K91840 Postprocedural hemorrhage and hematoma of a digestive system organ or structure following a digestive system procedure: Principal | ICD-10-CM | POA: Insufficient documentation

## 2018-06-29 DIAGNOSIS — K922 Gastrointestinal hemorrhage, unspecified: Secondary | ICD-10-CM | POA: Insufficient documentation

## 2018-06-29 DIAGNOSIS — D72829 Elevated white blood cell count, unspecified: Secondary | ICD-10-CM | POA: Insufficient documentation

## 2018-06-29 DIAGNOSIS — K5730 Diverticulosis of large intestine without perforation or abscess without bleeding: Secondary | ICD-10-CM

## 2018-06-29 DIAGNOSIS — Z23 Encounter for immunization: Secondary | ICD-10-CM | POA: Insufficient documentation

## 2018-06-29 DIAGNOSIS — R739 Hyperglycemia, unspecified: Secondary | ICD-10-CM | POA: Insufficient documentation

## 2018-06-29 DIAGNOSIS — K625 Hemorrhage of anus and rectum: Secondary | ICD-10-CM

## 2018-06-29 DIAGNOSIS — Z9889 Other specified postprocedural states: Secondary | ICD-10-CM | POA: Insufficient documentation

## 2018-06-29 DIAGNOSIS — K921 Melena: Secondary | ICD-10-CM

## 2018-06-29 DIAGNOSIS — K648 Other hemorrhoids: Secondary | ICD-10-CM | POA: Insufficient documentation

## 2018-06-29 DIAGNOSIS — R109 Unspecified abdominal pain: Secondary | ICD-10-CM

## 2018-06-29 LAB — CBC AND DIFFERENTIAL
Baso # K/uL: 0 10*3/uL (ref 0.0–0.1)
Baso # K/uL: 0 10*3/uL (ref 0.0–0.1)
Basophil %: 0.2 %
Basophil %: 0.4 %
Eos # K/uL: 0 10*3/uL (ref 0.0–0.5)
Eos # K/uL: 0.1 10*3/uL (ref 0.0–0.5)
Eosinophil %: 0.4 %
Eosinophil %: 0.4 %
Hematocrit: 43 % (ref 40–51)
Hematocrit: 36 % — ABNORMAL LOW (ref 40–51)
Hemoglobin: 13.9 g/dL (ref 13.7–17.5)
Hemoglobin: 11.7 g/dL — ABNORMAL LOW (ref 13.7–17.5)
IMM Granulocytes #: 0 10*3/uL
IMM Granulocytes #: 0 10*3/uL
IMM Granulocytes: 0.2 %
IMM Granulocytes: 0.3 %
Lymph # K/uL: 1.8 10*3/uL (ref 1.3–3.6)
Lymph # K/uL: 2.3 10*3/uL (ref 1.3–3.6)
Lymphocyte %: 19.2 %
Lymphocyte %: 19.9 %
MCH: 28 pg (ref 26–32)
MCH: 28 pg (ref 26–32)
MCHC: 33 g/dL (ref 32–37)
MCHC: 33 g/dL (ref 32–37)
MCV: 87 fL (ref 79–92)
MCV: 86 fL (ref 79–92)
Mono # K/uL: 0.6 10*3/uL (ref 0.3–0.8)
Mono # K/uL: 0.9 10*3/uL — ABNORMAL HIGH (ref 0.3–0.8)
Monocyte %: 6.1 %
Monocyte %: 8.3 %
Neut # K/uL: 6.8 10*3/uL — ABNORMAL HIGH (ref 1.8–5.4)
Neut # K/uL: 8.1 10*3/uL — ABNORMAL HIGH (ref 1.8–5.4)
Nucl RBC # K/uL: 0 10*3/uL (ref 0.0–0.0)
Nucl RBC # K/uL: 0 10*3/uL (ref 0.0–0.0)
Nucl RBC %: 0 /100 WBC (ref 0.0–0.2)
Nucl RBC %: 0 /100 WBC (ref 0.0–0.2)
Platelets: 243 10*3/uL (ref 150–330)
Platelets: 229 10*3/uL (ref 150–330)
RBC: 4.9 MIL/uL (ref 4.6–6.1)
RBC: 4.2 MIL/uL — ABNORMAL LOW (ref 4.6–6.1)
RDW: 13.2 % (ref 11.6–14.4)
RDW: 13.2 % (ref 11.6–14.4)
Seg Neut %: 73.9 %
Seg Neut %: 70.7 %
WBC: 9.2 10*3/uL — ABNORMAL HIGH (ref 4.2–9.1)
WBC: 11.4 10*3/uL — ABNORMAL HIGH (ref 4.2–9.1)

## 2018-06-29 LAB — BASIC METABOLIC PANEL
Anion Gap: 12 (ref 7–16)
CO2: 26 mmol/L (ref 20–28)
Calcium: 9.3 mg/dL (ref 8.6–10.2)
Chloride: 103 mmol/L (ref 96–108)
Creatinine: 1.26 mg/dL — ABNORMAL HIGH (ref 0.67–1.17)
GFR,Black: 73 *
GFR,Caucasian: 63 *
Glucose: 132 mg/dL — ABNORMAL HIGH (ref 60–99)
Lab: 13 mg/dL (ref 6–20)
Potassium: 5.1 mmol/L (ref 3.3–5.1)
Sodium: 141 mmol/L (ref 133–145)

## 2018-06-29 LAB — CBC
Hematocrit: 38 % — ABNORMAL LOW (ref 40–51)
Hemoglobin: 12.6 g/dL — ABNORMAL LOW (ref 13.7–17.5)
MCH: 28 pg (ref 26–32)
MCHC: 33 g/dL (ref 32–37)
MCV: 87 fL (ref 79–92)
Platelets: 232 10*3/uL (ref 150–330)
RBC: 4.4 MIL/uL — ABNORMAL LOW (ref 4.6–6.1)
RDW: 13 % (ref 11.6–14.4)
WBC: 11.2 10*3/uL — ABNORMAL HIGH (ref 4.2–9.1)

## 2018-06-29 LAB — PROTIME-INR
INR: 1 (ref 0.9–1.1)
Protime: 11.4 s (ref 10.0–12.9)

## 2018-06-29 LAB — APTT: aPTT: 26.9 s (ref 25.8–37.9)

## 2018-06-29 LAB — HOLD BLUE

## 2018-06-29 LAB — TYPE AND SCREEN
ABO RH Blood Type: O POS
Antibody Screen: NEGATIVE

## 2018-06-29 MED ORDER — DEXTROSE 5 % FLUSH FOR PUMPS *I*
0.0000 mL/h | INTRAVENOUS | Status: DC | PRN
Start: 2018-06-29 — End: 2018-06-30

## 2018-06-29 MED ORDER — SODIUM CHLORIDE 0.9 % FLUSH FOR PUMPS *I*
0.0000 mL/h | INTRAVENOUS | Status: DC | PRN
Start: 2018-06-29 — End: 2018-06-30

## 2018-06-29 MED ORDER — IOHEXOL 350 MG/ML (OMNIPAQUE) IV SOLN *I*
1.0000 mL | Freq: Once | INTRAVENOUS | Status: AC
Start: 2018-06-29 — End: 2018-06-29
  Administered 2018-06-29: 131 mL via INTRAVENOUS

## 2018-06-29 MED ORDER — SODIUM CHLORIDE 0.9 % IV SOLN WRAPPED *I*
50.0000 mL/h | Status: AC
Start: 2018-06-29 — End: 2018-06-29
  Administered 2018-06-29 (×6): 50 mL/h
  Administered 2018-06-29 (×3): 50 mL/h via INTRAVENOUS
  Administered 2018-06-30: 50 mL/h

## 2018-06-29 MED ORDER — STERILE WATER FOR IRRIGATION IR SOLN *I*
900.0000 mL | Freq: Once | Status: AC
Start: 2018-06-29 — End: 2018-06-29
  Administered 2018-06-29: 900 mL via ORAL

## 2018-06-29 MED ORDER — INFLUENZA VAC SPLIT QUAD (FLULAVAL) 0.5 ML IM SUSY PF(>=6 MONTHS) *I*
0.5000 mL | PREFILLED_SYRINGE | INTRAMUSCULAR | Status: AC
Start: 2018-06-29 — End: 2018-06-30
  Administered 2018-06-30: 0.5 mL via INTRAMUSCULAR
  Filled 2018-06-29: qty 0.5

## 2018-06-29 NOTE — ED Notes (Signed)
Patient had a colonoscopy done he had 5 polys removed. Patient reports he had a colonoscopy 5 years ago 2 polyps removed did not experience any complications. Tonight he had abdominal cramping and passed bright red blood. Patient has a IV from EMS 1g in his left ac flusjes well. Labs were drawn,

## 2018-06-29 NOTE — ED Obs Notes (Signed)
ED OBSERVATION ADMISSION NOTE    Patient seen by me today, 06/29/2018 at 1130    Current patient status: Observation    History     Chief Complaint   Patient presents with    Abdominal Pain    Post-op Problem       See eRecord for details of medical issues and recent visits/communications  See ED notes for current issue(s) resulting in EOU placement    Pt with hx of colonoscopy yesterday morning with 5 polyps removed. No report of complication. After returning home, pt noted rectal bleeding on several occasions. +/- abd discomfort. No report of dizziness or lightheadedness. No fever chill. No other gi sx of discomfort. Due to the persistent rectal bleeding -> ED for further evaluation    ED assessment determined rectal bleeding and stable labs. Placement in EOU for further care and monitoring. Pt reported no further bloody stool for hours.    Currently comfortable and anxious to eat.       History provided by:  Patient and medical records      Past Medical History:   Diagnosis Date    Asthma     Bursitis of right patella 07/23/2013    Esophageal reflux 01/29/2006    Created by Conversion     Neuromuscular disorder     left shoulder surgery       Past Surgical History:   Procedure Laterality Date    ENDOSCOPY, COLON, WITH POLYPECTOMY [phone]  06/28/2018         LIPOMA RESECTION      SHOULDER SURGERY Right        Family History   Problem Relation Age of Onset    High Blood Pressure Mother     Diabetes Mother     Dementia Mother     No Known Problems Father     High Blood Pressure Sister     High Blood Pressure Sister     Diabetes Sister     High Blood Pressure Sister     High Blood Pressure Sister     High Blood Pressure Sister     Colon cancer Neg Hx     Colon polyps Neg Hx        Social History      reports that he has never smoked. He has never used smokeless tobacco. He reports current alcohol use of about 1.0 standard drinks of alcohol per week. He reports being sexually active and has had  partner(s) who are Male. He reports that he does not use drugs.    Living Situation     Questions Responses    Patient lives with     Homeless     Caregiver for other family member     External Services     Employment     Domestic Violence Risk           Review of Systems   Review of Systems   Constitutional: Negative for activity change and fatigue.   HENT: Negative.    Eyes: Negative.    Respiratory: Negative.  Negative for chest tightness and shortness of breath.    Cardiovascular: Negative.  Negative for chest pain and palpitations.   Gastrointestinal: Positive for blood in stool. Negative for abdominal pain.   Genitourinary: Negative.    Musculoskeletal: Negative.    Skin: Negative.  Negative for rash.   Allergic/Immunologic: Negative for immunocompromised state.   Neurological: Negative.  Negative for dizziness, weakness and light-headedness.  Hematological: Negative for adenopathy.   Psychiatric/Behavioral: Negative.    All other systems reviewed and are negative.      Physical Exam   BP 125/71    Pulse (!) 111    Temp 36.7 C (98.1 F) (Temporal)    Resp 14    Ht 1.549 m (5\' 1" )    Wt 87.5 kg (193 lb)    SpO2 100%    BMI 36.47 kg/m     Physical Exam  Vitals signs and nursing note reviewed.   Constitutional:       General: He is not in acute distress.     Appearance: He is well-developed.   HENT:      Head: Normocephalic and atraumatic.      Nose: Nose normal.   Eyes:      Conjunctiva/sclera: Conjunctivae normal.   Neck:      Musculoskeletal: Normal range of motion and neck supple.   Cardiovascular:      Rate and Rhythm: Normal rate.   Pulmonary:      Effort: Pulmonary effort is normal. No respiratory distress.   Abdominal:      Palpations: Abdomen is soft.   Musculoskeletal: Normal range of motion.   Skin:     General: Skin is warm and dry.   Neurological:      Mental Status: He is alert and oriented to person, place, and time.   Psychiatric:         Behavior: Behavior normal.         Thought Content:  Thought content normal.         Judgment: Judgment normal.         Tests    EKG:   na    Labs:   All labs in the last 24 hours:   Recent Results (from the past 24 hour(s))   CBC and differential    Collection Time: 06/29/18  4:30 AM   Result Value Ref Range    WBC 9.2 (H) 4.2 - 9.1 THOU/uL    RBC 4.9 4.6 - 6.1 MIL/uL    Hemoglobin 13.9 13.7 - 17.5 g/dL    Hematocrit 43 40 - 51 %    MCV 87 79 - 92 fL    MCH 28 26 - 32 pg    MCHC 33 32 - 37 g/dL    RDW 13.2 11.6 - 14.4 %    Platelets 243 150 - 330 THOU/uL    Seg Neut % 73.9 %    Lymphocyte % 19.2 %    Monocyte % 6.1 %    Eosinophil % 0.4 %    Basophil % 0.2 %    Neut # K/uL 6.8 (H) 1.8 - 5.4 THOU/uL    Lymph # K/uL 1.8 1.3 - 3.6 THOU/uL    Mono # K/uL 0.6 0.3 - 0.8 THOU/uL    Eos # K/uL 0.0 0.0 - 0.5 THOU/uL    Baso # K/uL 0.0 0.0 - 0.1 THOU/uL    Nucl RBC % 0.0 0.0 - 0.2 /100 WBC    Nucl RBC # K/uL 0.0 0.0 - 0.0 THOU/uL    IMM Granulocytes # 0.0 THOU/uL    IMM Granulocytes 0.2 %   Basic metabolic panel    Collection Time: 06/29/18  4:30 AM   Result Value Ref Range    Glucose 132 (H) 60 - 99 mg/dL    Sodium 141 133 - 145 mmol/L    Potassium 5.1 3.3 - 5.1 mmol/L  Chloride 103 96 - 108 mmol/L    CO2 26 20 - 28 mmol/L    Anion Gap 12 7 - 16    UN 13 6 - 20 mg/dL    Creatinine 1.26 (H) 0.67 - 1.17 mg/dL    GFR,Caucasian 63 *    GFR,Black 73 *    Calcium 9.3 8.6 - 10.2 mg/dL   Type and screen    Collection Time: 06/29/18  4:30 AM   Result Value Ref Range    ABO RH Blood Type O RH POS     Antibody Screen Negative    Hold blue    Collection Time: 06/29/18  4:30 AM   Result Value Ref Range    Hold Blue HOLD TUBE    APTT    Collection Time: 06/29/18  4:30 AM   Result Value Ref Range    aPTT CANCELED 25.8 - 37.9 sec   Protime-INR    Collection Time: 06/29/18  4:30 AM   Result Value Ref Range    INR CANCELED 0.9 - 1.1   CBC    Collection Time: 06/29/18  7:44 AM   Result Value Ref Range    WBC 11.2 (H) 4.2 - 9.1 THOU/uL    RBC 4.4 (L) 4.6 - 6.1 MIL/uL    Hemoglobin 12.6 (L)  13.7 - 17.5 g/dL    Hematocrit 38 (L) 40 - 51 %    MCV 87 79 - 92 fL    MCH 28 26 - 32 pg    MCHC 33 32 - 37 g/dL    RDW 13.0 11.6 - 14.4 %    Platelets 232 150 - 330 THOU/uL   Protime-INR    Collection Time: 06/29/18  9:19 AM   Result Value Ref Range    Protime 11.4 10.0 - 12.9 sec    INR 1.0 0.9 - 1.1   APTT    Collection Time: 06/29/18  9:19 AM   Result Value Ref Range    aPTT 26.9 25.8 - 37.9 sec        Imaging:  CT abdomen and pelvis with IV contrast   Final Result      No free air. No free fluid. No bowel obstruction.       There are few scattered descending colonic diverticuli without evidence of diverticulitis.      END OF IMPRESSION      I have personally reviewed the images and the Resident's/Fellow's interpretation and agree with or edited the findings.      [facility] submits this DICOM format image data and final report to the [facility], an independent secure electronic health information exchange, on a reciprocally searchable basis (with patient authorization) for a minimum of 12 months after exam    date.      Abdomen FAS with PA chest   Final Result      No free air.         END OF IMPRESSION      I have personally reviewed the images and the Resident's/Fellow's interpretation and agree with or edited the findings.      [facility] submits this DICOM format image data and final report to the [facility], an independent secure electronic health information exchange, on a reciprocally searchable basis (with patient authorization) for a minimum of 12 months after exam    date.            Medical Decision Making        Assessment:  57 y.o., male placed in OBS after evaluation in the ED for rectal bleeding s/p multiple polypectomy several hours prior      Differential Diagnosis includes   Post op bleeding                        Plan:   Monitoring  Repeat labs        GI consult appreciated     Mathews Robinsons, MD  06/29/18 1158

## 2018-06-29 NOTE — ED Notes (Signed)
Pt finished with oral contrast- called CT.

## 2018-06-29 NOTE — Consults (Addendum)
Gastroenterology Consult    Admit Date:  06/29/2018  Attending Provider:  Mathews Robinsons, MD                                  Primary Care Physician:  Caroll Rancher, MD   Admitting Diagnosis: rectal bleeding    Consult reason: same    Subjective:      Chart reviewed. History obtained from patient and chart review    Carl Olson is 57 y.o. year old male with rectal bleeding. Patient underwent a colonoscopy yesterday (Friday Nov 15th) around 9 AM at [facility].. Had a total of 5 polyps removed by cold snare. One 4 mm cecal polyp and 4 polyps in the ascendi ng colon ranging in size betweeh 3 and 6 mm . Had sigmoid diverticulosis and small internal hemorrhoids     Starting around 3 PM yesterday . Began noticing some BRB in the stool. Had several more episodes later in afternoon and evening. By (PM starting feeling dizzy and sweaty . Came to ED . No further stools since around 9:30 PM yesterday .     Initail hct  43 with subsequent 38.     CT scan OK.   Presently in ED obs.  Clear liquids started.     No h/o bleeding disorder. No heavy lifting since procedure. Has not taken any NSAIDs since procedure. Does take a low dose aspirin daily.     No abdominal pain.     Does not smoke or drink.     Medications:   Home Medications:  Prior to Admission medications    Medication Sig Start Date End Date Taking? Authorizing Provider   Albuterol (VENTOLIN IN) Inhale into the lungs    [provider]   cetirizine (ZYRTEC) 10 MG tablet Take 10 mg by mouth daily    [provider]   mometasone (NASONEX) 50 MCG/ACT nasal spray instill 2 sprays into each nostril once daily 06/17/18   Caroll Rancher, MD   simvastatin (ZOCOR) 40 MG tablet take 1 tablet by mouth every evening with dinner 05/08/18   Caroll Rancher, MD   montelukast (SINGULAIR) 10 MG tablet Take 1 tablet (10 mg total) by mouth nightly as needed (asthma and allergies) 01/03/18 07/02/18  Caroll Rancher, MD   albuterol HFA 108 (90 Base) MCG/ACT inhaler  Inhale 1-2 puffs into the lungs every 6 hours as needed for Wheezing   Shake well before each use. 12/21/17   Caroll Rancher, MD   budesonide (PULMICORT Wannetta Sender) 180 MCG/ACT flexhaler Inhale 1 puff into the lungs 2 times daily 07/19/17   Caroll Rancher, MD   cholecalciferol (VITAMIN D) 2000 units tablet Take 1 tablet (2,000 Units total) by mouth daily 06/29/17   Caroll Rancher, MD   aspirin 81 MG tablet TAKE 1 TABLET DAILY. 03/31/10   Provider, Conversion   Calcium Carbonate-Vitamin D (CALTRATE 600+D) 600-400 MG-UNIT per tablet TAKE 1 TABLET TWICE DAILY  Patient taking differently: Take 1 tablet by mouth daily    07/31/08   Provider, Conversion     Hospital Medications:   influenza Vac Split Quad  0.5 mL Intramuscular Prior to discharge -vaccine     Family history:     Family History   Problem Relation Age of Onset    High Blood Pressure Mother     Diabetes Mother     Dementia Mother  No Known Problems Father     High Blood Pressure Sister     High Blood Pressure Sister     Diabetes Sister     High Blood Pressure Sister     High Blood Pressure Sister     High Blood Pressure Sister     Colon cancer Neg Hx     Colon polyps Neg Hx      PMH:     Past Medical History:   Diagnosis Date    Asthma     Bursitis of right patella 07/23/2013    Esophageal reflux 01/29/2006    Created by Conversion     Neuromuscular disorder     left shoulder surgery      Past Surgical History:   Procedure Laterality Date    ENDOSCOPY, COLON, WITH POLYPECTOMY [phone]  06/28/2018         LIPOMA RESECTION      SHOULDER SURGERY Right      Social history:     Social History     Tobacco Use    Smoking status: Never Smoker    Smokeless tobacco: Never Used   Substance Use Topics    Alcohol use: Yes     Alcohol/week: 1.0 standard drinks     Types: 1 Cans of beer per week     Comment: 1 beer every few weeks     Allergies:     Allergies   Allergen Reactions    Moxifloxacin Other (See Comments)     Penile glans swelling     Review  of Systems:   As per HPI  PHYSICAL EXAM:  Temp Readings from Last 3 Encounters:   06/29/18 36.7 C (98.1 F) (Temporal)   06/28/18 37.2 C (99 F) (Temporal)   01/03/18 36.8 C (98.3 F) (Oral)     BP Readings from Last 3 Encounters:   06/29/18 125/71   06/28/18 (!) 153/93   01/03/18 112/72     Pulse Readings from Last 3 Encounters:   06/29/18 (!) 111   01/03/18 68   06/29/17 64         Patient Vitals for the past 72 hrs:   BP Temp Temp src Pulse Resp SpO2 Height Weight   06/29/18 1012 125/71 36.7 C (98.1 F) TEMPORAL (!) 111 -- 100 % -- --   06/29/18 0339 120/77 36.7 C (98.1 F) TEMPORAL 88 14 98 % 154.9 cm (5' 1") 87.5 kg (193 lb)     Wt Readings from Last 3 Encounters:   06/29/18 87.5 kg (193 lb)   06/28/18 87.5 kg (193 lb)   01/03/18 88.5 kg (195 lb)     No intake/output data recorded.  General appearance: alert, appears stated age and cooperative  Head: Normocephalic, without obvious abnormality, atraumatic  Eyes: conjunctivae/corneas clear.   Neck: supple, symmetrical, trachea midline  Lungs: clear to auscultation bilaterally  Heart: regular rate and rhythm, S1, S2 normal, no murmur, click, rub or gallop  Abdomen: soft, non-tender; bowel sounds normal; no masses,  no organomegaly  Extremities: extremities normal, atraumatic, no cyanosis or edema  Neurologic: Grossly normal    LAB DATA:     Lab Results:    Recent Labs   Lab 06/29/18  0744 06/29/18  0430   WBC 11.2* 9.2*   Hemoglobin 12.6* 13.9   Hematocrit 38* 43   Platelets 232 243     No components found with this basename:  MCV  Recent Labs   Lab 06/29/18  0430   Sodium 141   Potassium 5.1   Chloride 103   CO2 26   UN 13   Creatinine 1.26*   Glucose 132*   Calcium 9.3      No results for input(s): TP, ALB, ALT, AST, ALK, TB, DB, GGT in the last 8760 hours.     Recent Labs   Lab 06/29/18  0919 06/29/18  0430   INR 1.0 CANCELED       No results found for: LIP  No results for input(s): AMY in the last 168 hours.     No results for input(s): CRP in  the last 168 hours.    No components found with this basename: SEDRATE   IMAGING:   Ct Abdomen And Pelvis With Iv Contrast    Result Date: 06/29/2018  No free air. No free fluid. No bowel obstruction. There are few scattered descending colonic diverticuli without evidence of diverticulitis. END OF IMPRESSION I have personally reviewed the images and the Resident's/Fellow's interpretation and agree with or edited the findings. [facility] submits this DICOM format image data and final report to the [facility], an independent secure electronic health information exchange, on a reciprocally searchable basis (with patient authorization) for a minimum of 12 months after exam date.    Abdomen Fas With Pa Chest    Result Date: 06/29/2018  No free air. END OF IMPRESSION I have personally reviewed the images and the Resident's/Fellow's interpretation and agree with or edited the findings. [facility] submits this DICOM format image data and final report to the [facility], an independent secure electronic health information exchange, on a reciprocally searchable basis (with patient authorization) for a minimum of 12 months after exam date.      ASSESSMENT:     57 y.o. year old male with rectal bleeding . Had colonoscopy yesterday with 5 polyps removed. One polyp removed from cecal region . That region of the colon  has a very thin wall. Possible culprit.   No further bleeding since 9:30 last evening. Appears stable hemodynamically.     PLAN:     1. Repeat hct around 4 PM today . Please call me with results . [phone]   2. If hct stable , can discharge but would keep on liquids for rest of day.(nothing red or purple) . No heavy lifting / no NSAIDs. Hold low dose aspirin for 72 hours.   3. If hct falls, will need to observe still. These bleeds usually stop on own . However, sometimes requires embolization by IR  or repeat colonoscopy with clipping of vessel.   4. Follow stool output color . However, I would not be  surprised if he passes dark stool representing old blood  over the next 24 - 48 hours.   5. Clears OK but not red or purple.   6. Doubt he will  need to be transfused but he would accept a transfusion if needed.               Author: Sammuel Hines. [place] Scheiderer MD   Note created: 06/29/2018  at: 11:22 AM   [phone]

## 2018-06-29 NOTE — ED Notes (Signed)
Pt comes in with c/o bloody stool s/p colonoscopy yesterday.  Per pt, he had a few bloody BMs but the one that prompted him to come to the hospital had dark red blood with "some clots".  States he felt a little dizzy and crampy at the time of the BM, but feels fine now.

## 2018-06-29 NOTE — ED Notes (Signed)
Patient reports he had a small episode of bleeding with a bowel movement about 10 minutes ago. Unwitnessed by staff.

## 2018-06-29 NOTE — ED Notes (Signed)
06/29/18 1009   Observation Care   Observation care initiated  Yes   Patient has been verbally notified of their observation status Yes   Report Recieved From ED Nurse Yes   Unit Based Transportation Requested Yes

## 2018-06-29 NOTE — ED Provider Notes (Signed)
History     Chief Complaint   Patient presents with    Abdominal Pain    Post-op Problem     57 year old male who presents with rectal bleeding after colonoscopy.  Patient had colonoscopy Friday 11/15, had 5 polyps removed by cold snare biopsy, when he got home he saw small amount of blood in the toilet and later in the evening noticed a large amount of medium red blood in the toilet.  Some mild abdominal discomfort at the time, but none currently.  No nausea or vomiting.          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            Past Surgical History:   Procedure Laterality Date    ENDOSCOPY, COLON, WITH POLYPECTOMY 45385  06/28/2018         LIPOMA RESECTION      SHOULDER SURGERY Right      Family History   Problem Relation Age of Onset    High Blood Pressure Mother     Diabetes Mother     Dementia Mother     No Known Problems Father     High Blood Pressure Sister     High Blood Pressure Sister     Diabetes Sister     High Blood Pressure Sister     High Blood Pressure Sister     High Blood Pressure Sister     Colon cancer Neg Hx     Colon polyps Neg Hx           Social History     Tobacco Use    Smoking status: Never Smoker    Smokeless tobacco: Never Used   Substance Use Topics    Alcohol use: Yes     Alcohol/week: 1.0 standard drinks     Types: 1 Cans of beer per week     Comment: 1 beer every few weeks    Drug use: No     Living Situation     Questions Responses    Patient lives with     Homeless     Caregiver for other  family member     External Services     Employment     Domestic Violence Risk                 Review of Systems   Review of Systems   All other systems reviewed and are negative.      Physical Exam     Triage Vitals  Triage Start: Start, (06/29/18 5621)   First Recorded BP: 120/77, Resp: 14, Temp: 36.7 C (98.1 F), Temp src: TEMPORAL Oxygen Therapy SpO2: 98 %, Oximetry Source: Lt Hand, O2 Device: None (Room air), Heart Rate: 88, (06/29/18 0339)  .  First Pain Reported  0-10 Scale: 0, (06/29/18 3086)  Physical Exam  Vitals signs and nursing note reviewed.   Constitutional:       General: He is not in acute distress.     Appearance: He is well-developed.      Comments: Appears comfortable, non-toxic   HENT:      Head: Normocephalic and atraumatic.   Eyes:      Pupils: Pupils are equal, round, and reactive to light.   Neck:      Musculoskeletal: Normal range of motion and neck supple.   Cardiovascular:      Rate and Rhythm: Normal rate and regular rhythm.      Heart sounds: No murmur.   Pulmonary:      Effort: Pulmonary effort is normal. No respiratory distress.      Breath sounds: Normal breath sounds.   Abdominal:      General: There is no distension.      Palpations: Abdomen is soft.      Tenderness: There is no tenderness.      Comments: No appreciable abdominal tenderness   Genitourinary:     Comments: Rectal exam: Grossly bloody  Musculoskeletal: Normal range of motion.   Skin:     General: Skin is warm and dry.      Findings: No rash.   Neurological:      Mental Status: He is alert and oriented to person, place, and time.      Cranial Nerves: No cranial nerve deficit.         Medical Decision Making   Patient seen by me on:  06/29/2018    Assessment:  57 year old male who presents with rectal bleeding after colonoscopy earlier in the day    Differential diagnosis:  Bleeding related to polyp removal, less likely bowel perforation, colitis.  Evaluate for blood loss anemia    Plan:  Orders Placed This  Encounter      Abdomen FAS with PA chest      CT abdomen and pelvis with IV contrast      CBC and differential      Basic metabolic panel      Hold blue      CBC      Protime-INR      APTT      Type and screen      Insert peripheral IV        ED Course and Disposition:  Initial hematocrit 43, resident prelim on CT shows no peritoneal free air, and a small focus of air within the bladder, final read is pending.  Discussed with Dr Ozella Almond from GI, who recommends repeat hematocrit, update with results, monitoring for several hours, we'll plan to admit.            Sherin Quarry, MD          Sherin Quarry, MD  06/29/18 [phone]

## 2018-06-29 NOTE — ED Notes (Signed)
Plan of Care     Pt oriented to the EOU. Pt independent with ADLs and ambulating to the bathroom. Pt instructed to utilize the call bell if any new or re-occurring symptoms arise, if prn medications are needed, or with any other needs. Pt informed that plan includes:    1) IV fluids  2) Lab monitoring  3) Clear liquid diet    Pt comfortable and agreeable with plan, no further questions at this time.

## 2018-06-29 NOTE — ED Triage Notes (Signed)
Pt had a colonoscopy on 11/1 5 polyps were removed. Pt woke at 0230 with lower abd cramping, felt the urge to have a BM . Pt passed bright red blood. Pt had nausea and dizziness at the time of the BM. .        Triage Note   Sherilyn Dacosta, RN

## 2018-06-30 LAB — CBC AND DIFFERENTIAL
Baso # K/uL: 0.1 10*3/uL (ref 0.0–0.1)
Basophil %: 0.5 %
Eos # K/uL: 0 10*3/uL (ref 0.0–0.5)
Eosinophil %: 0.3 %
Hematocrit: 34 % — ABNORMAL LOW (ref 40–51)
Hemoglobin: 11 g/dL — ABNORMAL LOW (ref 13.7–17.5)
IMM Granulocytes #: 0 10*3/uL
IMM Granulocytes: 0.2 %
Lymph # K/uL: 2.4 10*3/uL (ref 1.3–3.6)
Lymphocyte %: 24.8 %
MCH: 28 pg (ref 26–32)
MCHC: 32 g/dL (ref 32–37)
MCV: 88 fL (ref 79–92)
Mono # K/uL: 0.7 10*3/uL (ref 0.3–0.8)
Monocyte %: 6.7 %
Neut # K/uL: 6.6 10*3/uL — ABNORMAL HIGH (ref 1.8–5.4)
Nucl RBC # K/uL: 0 10*3/uL (ref 0.0–0.0)
Nucl RBC %: 0 /100 WBC (ref 0.0–0.2)
Platelets: 224 10*3/uL (ref 150–330)
RBC: 3.9 MIL/uL — ABNORMAL LOW (ref 4.6–6.1)
RDW: 13.4 % (ref 11.6–14.4)
Seg Neut %: 67.5 %
WBC: 9.8 10*3/uL — ABNORMAL HIGH (ref 4.2–9.1)

## 2018-06-30 NOTE — ED Notes (Signed)
Discussed admission and discharge instructions with patient. No new medication orders at this time. Pt instructed not to take any aspirin, ibuprofen, or Aleve. Pt instructed to continue all other routine medications as prescribed. IV removed. Pt instructed to follow up with PCP and Gastroenterologist. Pt comfortable and agreeable with plan, no further questions at this time. Pt to discharge.

## 2018-06-30 NOTE — Discharge Instructions (Addendum)
Continue on all of your current medication and care plan- with exception list below, if any  Follow up with your doctor in the next few days  Return if further problem or concern      In Addition-  If condition changes or worsen, return immediately    Full liquid today and resume regular diet tomorrow  Have your blood test repeat tomorrow to trend the bleeding issue  No aspirin or ibuprofen/aleve

## 2018-06-30 NOTE — ED Obs Notes (Signed)
ED OBSERVATION PROGRESS NOTE     Patient seen by me today, 06/30/2018 at 7:56 AM    Current patient status: Observation    Chief Complaint:   Chief Complaint   Patient presents with    Abdominal Pain    Post-op Problem       Subjective:    Feeling well  Had 2 BM of dark red stool yesterday  No bright red blood reported.   No abd pain  Denies dizziness or lightheadedness      Per NS, uneventful evening and no acute event overnight.      Nursing Pain Score:  Last Nursing documented pain:  0-10 Scale: 0 (06/30/18 0554)      Vitals:   Patient Vitals for the past 24 hrs:   BP Temp Temp src Pulse Resp SpO2   06/30/18 0622 130/69 36.6 C (97.9 F) TEMPORAL 97 18 99 %   06/30/18 0135 113/66 36.3 C (97.3 F) TEMPORAL 94 16 100 %   06/29/18 2227 111/64 36.6 C (97.9 F) TEMPORAL 97 16 100 %   06/29/18 1839 113/81 36.7 C (98.1 F) TEMPORAL 110 14 100 %   06/29/18 1403 119/66 36.7 C (98.1 F) TEMPORAL 109 -- 98 %   06/29/18 1012 125/71 36.7 C (98.1 F) TEMPORAL (!) 111 -- 100 %       Physical Examination:  Physical Exam  Vitals signs and nursing note reviewed.   Constitutional:       General: He is not in acute distress.     Appearance: He is well-developed.      Comments: Well appearance and in no obvious acute distress or discomfort.     HENT:      Head: Normocephalic and atraumatic.      Nose: Nose normal.   Eyes:      Conjunctiva/sclera: Conjunctivae normal.   Neck:      Musculoskeletal: Normal range of motion and neck supple.   Pulmonary:      Effort: Pulmonary effort is normal. No respiratory distress.   Abdominal:      Palpations: Abdomen is soft.   Musculoskeletal: Normal range of motion.   Skin:     General: Skin is warm and dry.   Neurological:      Mental Status: He is alert and oriented to person, place, and time.   Psychiatric:         Behavior: Behavior normal.         Thought Content: Thought content normal.         Judgment: Judgment normal.         EKG:   na    Lab Results:         Lab results:  06/30/18  0602 06/29/18  1609 06/29/18  0744 06/29/18  0430   WBC 9.8* 11.4* 11.2* 9.2*   Hemoglobin 11.0* 11.7* 12.6* 13.9   Hematocrit 34* 36* 38* 43   RBC 3.9* 4.2* 4.4* 4.9   Platelets 224 229 232 243   Neut # K/uL 6.6* 8.1*  --  6.8*   Lymph # K/uL 2.4 2.3  --  1.8   Mono # K/uL 0.7 0.9*  --  0.6   Eos # K/uL 0.0 0.1  --  0.0   Baso # K/uL 0.1 0.0  --  0.0   Seg Neut % 67.5 70.7  --  73.9   Lymphocyte % 24.8 19.9  --  19.2   Monocyte % 6.7 8.3  --  6.1   Eosinophil % 0.3 0.4  --  0.4   Basophil % 0.5 0.4  --  0.2                 Lab results: 06/29/18  0430 06/08/18  0849   Sodium 141 143   Potassium 5.1 4.7   Chloride 103 102   CO2 26 27   UN 13 11   Creatinine 1.26* 1.22*   GFR,Caucasian 63 66   GFR,Black 73 76   Glucose 132* 100*   Calcium 9.3 9.7             Lab results: 06/29/18  0919 06/29/18  0430   Protime 11.4  --    INR 1.0 CANCELED           Imaging findings:   CT abdomen and pelvis with IV contrast   Final Result      No free air. No free fluid. No bowel obstruction.       There are few scattered descending colonic diverticuli without evidence of diverticulitis.      END OF IMPRESSION      I have personally reviewed the images and the Resident's/Fellow's interpretation and agree with or edited the findings.      [facility] submits this DICOM format image data and final report to the [facility], an independent secure electronic health information exchange, on a reciprocally searchable basis (with patient authorization) for a minimum of 12 months after exam    date.      Abdomen FAS with PA chest   Final Result      No free air.         END OF IMPRESSION      I have personally reviewed the images and the Resident's/Fellow's interpretation and agree with or edited the findings.      [facility] submits this DICOM format image data and final report to the [facility], an independent secure electronic health information exchange, on a reciprocally searchable basis (with patient authorization) for a  minimum of 12 months after exam    date.            Assessment:   Rectal bleeding- post colonoscopy with polypectomy.           Plan:   Will consult with GI- prob will be able to go home and f/u with pcp/gi outpt  Repeat labs    Author: Mathews Robinsons, MD  Note created: 06/30/2018  at: 7:56 AM     Mathews Robinsons, MD  06/30/18 [phone]

## 2018-06-30 NOTE — Progress Notes (Signed)
Gastroenterology Progress Note    Subjective:  Had 2 bowel movements . Last BM at 6 PM Saturday (yesterday) . States that blood was clearing up.  Feels well. No abdominal pain.    Latest hct 34 .       Hospital Medications:   influenza Vac Split Quad  0.5 mL Intramuscular Prior to discharge -vaccine       Objective:  Vital signs in last 24 hours:  BP: (111-130)/(64-81)   Temp:  [36.3 C (97.3 F)-36.7 C (98.1 F)]   Temp src: Temporal (11/17 0622)  Heart Rate:  [94-111]   Resp:  [14-18]   SpO2:  [98 %-100 %]     Intake/Output Summary (Last 24 hours) at 06/30/2018 0906  Last data filed at 06/30/2018 0554  Gross per 24 hour   Intake 995.26 ml   Output --   Net 995.26 ml      General appearance: alert, appears stated age and cooperative  Head: Normocephalic, without obvious abnormality, atraumatic  Eyes: conjunctivae/corneas clear.   Neck: supple, symmetrical, trachea midline  Lungs: clear to auscultation bilaterally  Heart: regular rate and rhythm, S1, S2 normal, no murmur, click, rub or gallop  Abdomen: soft, non-tender; bowel sounds normal; no masses,  no organomegaly  Extremities: extremities normal, atraumatic, no cyanosis or edema  Neurologic: Grossly normal    Lab Results:  Recent Labs   Lab 06/30/18  0602 06/29/18  1609 06/29/18  0744   WBC 9.8* 11.4* 11.2*   Hemoglobin 11.0* 11.7* 12.6*   MCV 88 86 87   Hematocrit 34* 36* 38*   Platelets 224 229 232            Recent Labs   Lab 06/29/18  0430   Sodium 141   Potassium 5.1   Chloride 103   CO2 26   UN 13   Creatinine 1.26*   Glucose 132*   Calcium 9.3      No results for input(s): TP, ALB, ALT, AST, ALK, TB, DB, GGT in the last 8760 hours.    Recent Labs   Lab 06/29/18  0919 06/29/18  0430   INR 1.0 CANCELED       No results found for: LIP  No results for input(s): AMY in the last 168 hours.     No components found with this basename: LIPASE, AMYLASE    Imaging:  Ct Abdomen And Pelvis With Iv Contrast    Result Date: 06/29/2018  No free air. No free fluid. No  bowel obstruction. There are few scattered descending colonic diverticuli without evidence of diverticulitis. END OF IMPRESSION I have personally reviewed the images and the Resident's/Fellow's interpretation and agree with or edited the findings. [facility] submits this DICOM format image data and final report to the [facility], an independent secure electronic health information exchange, on a reciprocally searchable basis (with patient authorization) for a minimum of 12 months after exam date.    Abdomen Fas With Pa Chest    Result Date: 06/29/2018  No free air. END OF IMPRESSION I have personally reviewed the images and the Resident's/Fellow's interpretation and agree with or edited the findings. [facility] submits this DICOM format image data and final report to the [facility], an independent secure electronic health information exchange, on a reciprocally searchable basis (with patient authorization) for a minimum of 12 months after exam date.        Assessment: This is a 57 yo male with lower GI bleed. Post  polypectomy bleed suspected. Had cecal polyp removed Friday Nov 15th in AM. .   No stools in>12 hours. Last stool at 6 PM had evidence that bleeding was clearing up.   Can be discharged today.     Plan:  1. OK to discharge   2. Hold aspirin for another 3 days.   3. No heavy lifting for 3 days. Does HVAC work. May need an excuse to allow him to go to work but restrict lifting to 10 - 15 pounds.   4. Recheck hct tomorrow. Please cc PCP and Dr Crist Infante.   5. Follow up with Dr Crist Infante , his outpt GI physician.    6. Can have full liquids / soup today. If continues to do well , solids tomorrow (Monday) .  7. If bleeding recurs, return to ED.        Author: Sammuel Hines. Dziwis MD   Note created: 06/30/2018  at: 9:06 AM     [phone]

## 2018-07-01 ENCOUNTER — Telehealth: Payer: Self-pay

## 2018-07-01 ENCOUNTER — Other Ambulatory Visit
Admission: RE | Admit: 2018-07-01 | Discharge: 2018-07-01 | Disposition: A | Discharge status: Home / Self Care | Payer: Commercial Managed Care - PPO | Source: Ambulatory Visit | Attending: Emergency Medicine | Admitting: Emergency Medicine

## 2018-07-01 DIAGNOSIS — K625 Hemorrhage of anus and rectum: Secondary | ICD-10-CM | POA: Insufficient documentation

## 2018-07-01 LAB — CBC
Hematocrit: 35 % — ABNORMAL LOW (ref 40–51)
Hemoglobin: 11 g/dL — ABNORMAL LOW (ref 13.7–17.5)
MCH: 29 pg/cell (ref 26–32)
MCHC: 32 g/dL (ref 32–37)
MCV: 91 fL (ref 79–92)
Platelets: 248 10*3/uL (ref 150–330)
RBC: 3.8 MIL/uL — ABNORMAL LOW (ref 4.6–6.1)
RDW: 13.2 % (ref 11.6–14.4)
WBC: 9.6 10*3/uL — ABNORMAL HIGH (ref 4.2–9.1)

## 2018-07-01 NOTE — Telephone Encounter (Signed)
Call made to the patient per DR. Sprung and left generic message with wife as" patient not home and would be back in 30 min" that I would call back. ( call is to check and see how he is doiong as he went to ED over weekend following colonoscopy but was discharged. Writer will call patient after 30 min. Today. Will let Dr. Crist Infante know.

## 2018-07-01 NOTE — Telephone Encounter (Signed)
Call made to the patient per DR. Sprung to see how he was doing. Per the patient no further rectal bleeding since yesterday  after discharge form ED. " Also denies fever , abd pain or nausea or vomiting.

## 2018-07-01 NOTE — Telephone Encounter (Signed)
Called pt in follow up to ED visit on 11/16 - 06/30/18 for rectal bleeding post colonoscopy earlier on the 16 th. Pt states he is feeling well has not had a BM since returning home so no bleeding noted and no c/o pain. Pt already had CBC ordered for today drawn. Pt aware of appointment with Dr. Marsa Aris on 07/05/18 @ 9:00 am.

## 2018-07-01 NOTE — Telephone Encounter (Signed)
Post procedure follow up call: Spoke with Carl Olson. Patient stated "I had rectal bleeding and went to [facility]". Dr. Crist Infante made aware; states he was already made aware and nursing staff will f/u with patient; see notes. Patient denies any symptoms other than "a little tired". Reinforced patient to call the phone number on discharge form for any further questions or concerns in the future.

## 2018-07-02 ENCOUNTER — Encounter: Payer: Self-pay | Admitting: Liver Pancreas GI Surgery

## 2018-07-02 LAB — SURGICAL PATHOLOGY

## 2018-07-05 ENCOUNTER — Telehealth: Payer: Self-pay

## 2018-07-05 ENCOUNTER — Ambulatory Visit: Payer: Commercial Managed Care - PPO | Attending: Primary Care | Admitting: Primary Care

## 2018-07-05 ENCOUNTER — Encounter: Payer: Self-pay | Admitting: Primary Care

## 2018-07-05 VITALS — BP 118/62 | HR 64 | Ht 60.0 in | Wt 191.0 lb

## 2018-07-05 DIAGNOSIS — R011 Cardiac murmur, unspecified: Secondary | ICD-10-CM

## 2018-07-05 DIAGNOSIS — Z23 Encounter for immunization: Secondary | ICD-10-CM

## 2018-07-05 DIAGNOSIS — R000 Tachycardia, unspecified: Secondary | ICD-10-CM

## 2018-07-05 DIAGNOSIS — E559 Vitamin D deficiency, unspecified: Secondary | ICD-10-CM

## 2018-07-05 DIAGNOSIS — D126 Benign neoplasm of colon, unspecified: Secondary | ICD-10-CM

## 2018-07-05 DIAGNOSIS — J4541 Moderate persistent asthma with (acute) exacerbation: Secondary | ICD-10-CM

## 2018-07-05 DIAGNOSIS — Z0000 Encounter for general adult medical examination without abnormal findings: Secondary | ICD-10-CM

## 2018-07-05 DIAGNOSIS — K922 Gastrointestinal hemorrhage, unspecified: Secondary | ICD-10-CM

## 2018-07-05 DIAGNOSIS — E7800 Pure hypercholesterolemia, unspecified: Secondary | ICD-10-CM

## 2018-07-05 DIAGNOSIS — J3089 Other allergic rhinitis: Secondary | ICD-10-CM

## 2018-07-05 DIAGNOSIS — D649 Anemia, unspecified: Secondary | ICD-10-CM

## 2018-07-05 DIAGNOSIS — E669 Obesity, unspecified: Secondary | ICD-10-CM

## 2018-07-05 MED ORDER — ATORVASTATIN CALCIUM 40 MG PO TABS *I*
40.0000 mg | ORAL_TABLET | Freq: Every day | ORAL | 3 refills | Status: DC
Start: 2018-07-05 — End: 2019-04-10

## 2018-07-05 MED ORDER — MONTELUKAST SODIUM 10 MG PO TABS *I*
10.0000 mg | ORAL_TABLET | Freq: Every evening | ORAL | 5 refills | Status: AC | PRN
Start: 2018-07-05 — End: ?

## 2018-07-05 NOTE — Telephone Encounter (Signed)
LMTCB    Order: ECHO LIMITED Order #: [id number]   Order Date: 07/05/2018 Expected Date: 07/05/2018   Priority: Routine [6] Expiration Date: 08/05/2019   Indication: Other(See Comment) Auth Provider: Lenetta Quaker Dept.: [place]

## 2018-07-05 NOTE — NoShare Progress Note (Signed)
Patient ID: Carl Olson is a 56 y.o. year old male who presents today forAnnual Exam and Colonoscopy (had excessive bleeding after procedure had to go to [facility] )    BP 118/62 (BP Location: Left arm, Patient Position: Sitting, Cuff Size: large adult)    Pulse 64    Ht 1.524 m (5')    Wt 86.6 kg (191 lb)    BMI 37.30 kg/m   Estimated body mass index is 37.3 kg/m as calculated from the following:    Height as of this encounter: 1.524 m (5').    Weight as of this encounter: 86.6 kg (191 lb).    SUBJECTIVE        57 year old with history of high cholesterol, asthma, reflux, allergic rhinitis    Patient presents for preventive health care visit and hospital follow up     Hospitalized last week with post colonoscopy/polypectomy GI bleeding      He had bleeding start 3 hours after the colonoscopy.  Large volume progressive, had dizziness, self resolved eventually during the hospitalization. Didn't require transfusion.   Has had a single blood count since hospital discharge and stable.     He felt quite weak initially but is feeling stronger gradually though not to his baseline  He has had blood in his stools twice this week in the first two days.   He has had no blood in stools the last two days.    He is back on a regular diet.    He has talked to Dr Howell Rucks team via the phone and they recommended observation only.     ROS no chest pain no SOB at this time,     Asthma doing well. He has had flares two years in a row in  May.    CHOL - he remains on simvastatin           Medications, allergies, past medical history, social history reviewed and updated in electronic record and full ROS reviewed with patient Review of Systems -  Review of Systems   Constitutional: Positive for malaise/fatigue. Negative for chills, fever and weight loss.   HENT: Positive for sore throat (mild sore thrat 2-3 weeks seems to be self resolving.   ). Negative for ear pain, hearing loss and tinnitus.    Eyes: Negative for blurred vision, double  vision, photophobia, pain and discharge.   Respiratory: Negative for cough, shortness of breath and wheezing.    Cardiovascular: Negative for chest pain, palpitations and leg swelling.   Gastrointestinal: Negative for abdominal pain, constipation, diarrhea, heartburn, melena, nausea and vomiting.   Genitourinary: Negative for dysuria, hematuria and urgency.   Musculoskeletal: Positive for back pain (minor chronic aches and pains ), joint pain (minor chronic aches and pains ) and myalgias (minor chronic aches and pains ). Negative for falls and neck pain.   Skin: Negative for itching and rash.   Neurological: Negative for dizziness, seizures and headaches.   Endo/Heme/Allergies: Negative for environmental allergies and polydipsia. Does not bruise/bleed easily.   Psychiatric/Behavioral: Negative for depression, hallucinations, memory loss and suicidal ideas.                  Allergy / Social History / Medications:     Allergies   Allergen Reactions    Moxifloxacin Other (See Comments)     Penile glans swelling     Family History   Problem Relation Age of Onset    High Blood Pressure Mother  Diabetes Mother     Dementia Mother     No Known Problems Father     High Blood Pressure Sister     High Blood Pressure Sister     Diabetes Sister     High Blood Pressure Sister     High Blood Pressure Sister     High Blood Pressure Sister     Colon cancer Neg Hx     Colon polyps Neg Hx      Past Medical History:   Diagnosis Date    Asthma     Colonic polyp tubular adenoma next scope due 2022 November  12/27/2016    2019 colonoscopy  FINAL DIAGNOSIS:  (A) Cecum, polyp, biopsy:    - Tubular adenoma.  (B) Colon, right, polyps, biopsy:    - Tubular adenomas.  (C) Colon, transverse, polyps, biopsy,    - Tubular adenomas.    43yr ago  Surgical Pathology  FINAL DIAGNOSIS: Colon, cecum, biopsy: - Tubular adenoma.       Esophageal reflux 01/29/2006    Created by Conversion     High cholesterol 04/04/2007     Lower GI bleed 06/2018    After colonoscopy with polypectomy     Vitamin D deficiency 05/05/2008         Social History     Patient does not qualify to have social determinant information on file (likely too young).   Social History Narrative    Married, two step daughters.  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           Social History     Tobacco Use    Smoking status: Never Smoker    Smokeless tobacco: Never Used   Substance Use Topics    Alcohol use: Yes     Comment: 1 beer every few weeks             OBJECTIVE     Vitals:    07/05/18 0904   BP: 118/62   Pulse: 64   Weight: 86.6 kg (191 lb)   Height: 1.524 m (5')     General: Well-developed/well-nourished  .  No acute distress.  Appears stated age.  Skin: Full skin exam performed with exception of perirectal and perineal area, No rash.  No atypical nevi.   Lymph nodes: No cervical, submandibular, supraclavicular, axillary, inguinal lymphadenopathy.  HEENT: Pupils symmetric.  Conjunctiva pink.  Sclera anicteric.  Turbinates without edema or erythema.  Oropharynx moist with no lesions.  TMs are shiny .  Neck: No thyromegaly or thyroid nodules. No other masses, no crepitus. No carotid bruits.  No jugular venous distention.  Chest/Breast:   Recumbent exams.  No mass or tenderness.  Skin without dimpling or retraction.  No nipple discharge.  Lungs: Clear to auscultation bilaterally, normal respiratory effort, no wheeze or rales    Cardiac:  Regular but tachy at 1112 bpm  S1 and S2 with new systolic murmur throughout precordium soft 2/6 short, no  rub, gallop.  PMI nondisplaced.  Abdomen: Soft, non tender, non distended.  No palpable HSM or mass.  Normal active bowel sounds.  Genitals: Testes descended bilaterally, no palpable mass or tenderness.  Penis not visualized as unable to withdraw foreskin asked to pull back daily    Rectal/prostate: Prostate is non tender.  Prostate is not  enlarged.  No palpable nodule.  No rectal mass.   Marland Kitchen   Spine/musculoskeletal: Spine is  non tender.  No joint deformities .  Good functional range of motion  Extremities: No clubbing, cyanosis, edema.  Vascular: 2+ radial, brachial, femoral, dorsalis pedis pulses bilaterally.  Neurologic: Patient is alert and oriented.  Sensation intact to light touch  Gait is without ataxia.      Psych:  Appropriate historian  , normal affect and interaction, good eye contact.       Recent Labs and Other Results     Lab Results   Component Value Date    NA 141 06/29/2018    K 5.1 06/29/2018    CL 103 06/29/2018    CO2 26 06/29/2018    UN 13 06/29/2018    CREAT 1.26 (H) 06/29/2018    VID25 28 (L) 06/08/2018    WBC 9.6 (H) 07/01/2018    HGB 11.0 (L) 07/01/2018    HCT 35 (L) 07/01/2018    PLT 248 07/01/2018    TSH 1.62 01/22/2016    CHOL 189 06/08/2018    TRIG 108 06/08/2018    HDL 54 06/08/2018    LDLC 113 06/08/2018    CHHDC 3.5 06/08/2018     Hemoglobin A1C   Date Value Ref Range Status   12/28/2016 6.0 4.0 - 6.0 % Final     Comment:                                   NON-DIABETIC       Ref Range & Units 4d ago  (07/01/18) 5d ago  (06/30/18) 6d ago  (06/29/18) 6d ago  (06/29/18)             Hemoglobin 13.7 - 17.5 g/dL 11.0Low   11.0Low   11.7Low   12.6Low     Hematocrit 40 - 51 % 35Low   34 Low   36Low   38Low            Ref Range & Units 54yr ago 50yr ago   WBC 4.2 - 9.1 THOU/uL 6.7  8.7    RBC 4.6 - 6.1 MIL/uL 5.4  5.5    Hemoglobin 13.7 - 17.5 g/dL 15.0  15.4    Hematocrit 40 - 51 % 47  47      Component 7d ago   Surgical Pathology [id number]   FINAL DIAGNOSIS:   (A) Cecum, polyp, biopsy:     - Tubular adenoma.   (B) Colon, right, polyps, biopsy:     - Tubular adenomas.   (C) Colon, transverse, polyps, biopsy,     - Tubular adenomas.      06/29/2018 7:05 AM    CT ABDOMEN AND PELVIS WITH CONTRAST    CLINICAL INFORMATION:  bloody stools, abd pain after recent colonoscopy.    COMPARISON:  None.    PROCEDURE:  Contiguous  images were obtained through the abdomen and pelvis with intravenous contrast. Automated exposure control, adjustment of the mA and/or kV according to patient size, and/or iterative reconstruction techniques were utilized for   radiation dose optimization. Amount and type of contrast that was injected and/or discarded is recorded in the electronic medical record.    FINDINGS:    Chest Base: Unremarkable.     Liver/Biliary Tract: 0.8 cm right hepatic hypodensity likely represents a hepatic cyst. There are additional subcentimeter hepatic hypodensities, too small to characterize likely represent small cysts.    Pancreas: Unremarkable.    Spleen: Unremarkable.    Adrenals: Unremarkable.  Kidneys and Collecting Systems: Unremarkable.    Lymph Nodes: Unremarkable.    Vessels: Unremarkable for age.    GI Tract/Mesentery and Peritoneal Cavity: No free air. No free fluid. Normal contrast-filled appendix. No evidence of bowel obstruction. There are few scattered descending colonic diverticuli without evidence of diverticulitis. Contrast extends   throughout the colon limiting evaluation of intraluminal hemorrhage.    Prostate Glands/Seminal Vesicles: Unremarkable.    Bladder: Unremarkable.    Soft Tissues/Musculoskeletal: No acute abnormality.    IMPRESSION:    No free air. No free fluid. No bowel obstruction.     There are few scattered descending colonic diverticuli without evidence of diverticulitis.      EKG sinus axis positive rate 91 no ischemic changes      ASSESSMENT AND PLAN     1. Health care maintenance  Basic metabolic panel    Lipid Panel (Reflex to Direct  LDL if Triglycerides more than 400)    Vitamin D    CBC    PSA (eff.11-2008)   2. Lower GI bleed  CBC    CBC   3. Adenomatous polyp of colon, unspecified part of colon     4. Vitamin D deficiency  Vitamin D   5. Moderate persistent asthma with acute exacerbation  montelukast (SINGULAIR) 10 MG tablet   6. Seasonal allergic rhinitis due to  other allergic trigger  montelukast (SINGULAIR) 10 MG tablet   7. Anemia, unspecified type  CBC    CBC   8. High cholesterol  Lipid Panel (Reflex to Direct  LDL if Triglycerides more than 400)    atorvastatin (LIPITOR) 40 MG tablet    Lipid Panel (Reflex to Direct  LDL if Triglycerides more than 400)   9. Obesity, unspecified classification, unspecified obesity type, unspecified whether serious comorbidity present     10. Need for vaccination  Td vaccine greater than or equal to 7yo IM   11. Cardiac murmur  Echo Limited   12. Tachycardia  TSH 6 weeks           Health Maintenance  Preventive health guidelines reviewed with patient and highlighted particular issues  pertinent to their health, gender, and age.    Discussed preventive lab testing and immunizations. See discussion below and orders for particular items discussed at today's visit.      Items discussed include (but are not limited only to) those indicated below     Health care proxy / advance directives    Reviewed on file document   Lifestyle modification including diet and exercise -  Encouraged him to start doing something regularly.   Wt Readings from Last 4 Encounters:   07/05/18 86.6 kg (191 lb)   06/29/18 87.5 kg (193 lb)   06/28/18 87.5 kg (193 lb)   01/03/18 88.5 kg (195 lb)        Skin CA awareness/prevention discussed  --Sunscreen use / long sleeved clothing  --Monitor moles for the following changes:    A: asymmetry   B: border changes   C: color changes    D: diameter greater than a pencil eraser size    E: evolving, or changing mole       Seat belt use counseled     Gun safe storage review offered     Dentistry yearly advised    [place] every 1-2 years advised        Reviewed blood work on file / discussed additional blood tests    Hepatitis C  screening for those born between 1945 and [place] previously   EKG for those with HTN/significant CVD risk factors or age > 44          Prostate cancer screening      Testicular self exam discussed            Vaccinations:  Immunization History   Administered Date(s) Administered    Influenza Adult(43yr and up) 06/05/2011    Influenza Quadrivalent 0.15mL prefilled syringe/single dose vial(FluLaval,Fluzone,Afluria,Fluarix) 06/28/2012, 07/20/2015, 07/13/2016, 06/29/2017, 06/30/2018    Influenza Whole 07/08/2007, 05/04/2008, 05/06/2009    Influenza multi-dose vial 06/12/2013    Pneumococcal Polysaccharide (Pneumovax) 06/29/2017    Td 07/05/2018    Tdap 03/02/2008       Yearly flu shot advised  Done    TDAP for patients less than 65 or with exposure to young children due TD today   Shingrix vaccination discussed and advised for patients 70 and older   He declines to do right now        CV Risk stratification  Reviewed 2018 AHA cholesterol management guidelines with patient and discussed on my review how I think they apply to this patients care.      Lab Results   Component Value Date    CHOL 189 06/08/2018    HDL 54 06/08/2018      Risk calculation tool done and discussed with patient.    The 10-year ASCVD risk score Mikey Bussing DC Brooke Bonito., et al., 2013) is: 5.8%    Values used to calculate the score:      Age: 32 years      Sex: Male      Is Non-Hispanic African American: Yes      Diabetic: No      Tobacco smoker: No      Systolic Blood Pressure: 381 mmHg      Is BP treated: No      HDL Cholesterol: 54 mg/dL      Total Cholesterol: 189 mg/dL  We reviewed potential  Risk Enhancers       Metabolic syndrome        Reviewed 2015 USPSTF guidelines on Aspirin for primary CV prevention and discussed how they apply to this patient and lack of clear guidelines age 61+.   Noted risk of bleeding and some disagreement between different guidelines on pros and cons of aspirin  for primary prevention.    We discussed NEJM articles from fall 2018 and how they have effected our understanding of pros and cons of aspirin for primary prevention.     Advised stopping ASA based on current guidelines.               GI bleed post polypectom   doing better asked tocall if any recurrent bleeding.         Asthma with allergies advised to get ahead of allergy season in the spring to avoid flares      Vitamin D he reports taking meds    Obesity encouraged weight loss     Murmur/tachycardia - suspect related to anemia check echo however and TSH with next labs     Orders and Meds       Outpatient Encounter Medications as of 07/05/2018   Medication Sig Dispense Refill    cetirizine (ZYRTEC) 10 MG tablet Take 10 mg by mouth daily      mometasone (NASONEX) 50 MCG/ACT nasal spray instill 2 sprays into each nostril once daily 17 g 5    [  DISCONTINUED] simvastatin (ZOCOR) 40 MG tablet take 1 tablet by mouth every evening with dinner 90 tablet 1    albuterol HFA 108 (90 Base) MCG/ACT inhaler Inhale 1-2 puffs into the lungs every 6 hours as needed for Wheezing   Shake well before each use. 3 Inhaler 2    [DISCONTINUED] aspirin 81 MG tablet TAKE 1 TABLET DAILY.  0    Calcium Carbonate-Vitamin D (CALTRATE 600+D) 600-400 MG-UNIT per tablet TAKE 1 TABLET TWICE DAILY (Patient taking differently: Take 1 tablet by mouth daily   ) 60 5    montelukast (SINGULAIR) 10 MG tablet Take 1 tablet (10 mg total) by mouth nightly as needed (asthma and allergies) 30 tablet 5    atorvastatin (LIPITOR) 40 MG tablet Take 1 tablet (40 mg total) by mouth daily 90 tablet 3    [DISCONTINUED] Albuterol (VENTOLIN IN) Inhale into the lungs      [DISCONTINUED] montelukast (SINGULAIR) 10 MG tablet Take 1 tablet (10 mg total) by mouth nightly as needed (asthma and allergies) (Patient not taking: Reported on 07/05/2018) 30 tablet 5    budesonide (PULMICORT FLEXHALER) 180 MCG/ACT flexhaler Inhale 1 puff into the lungs 2 times daily 1 Inhaler 3    cholecalciferol (VITAMIN D) 2000 units tablet Take 1 tablet (2,000 Units total) by mouth daily (Patient not taking: Reported on 07/05/2018) 100 capsule 3     No facility-administered encounter medications on file as of 07/05/2018.          Signed:  Caroll Rancher, MD on 07/05/2018 at 3:14 PM

## 2018-07-05 NOTE — Patient Instructions (Addendum)
Weight loss is an important part of your overall medical plan.  We are shooting for a gradual weight loss of 2-4 pounds per month.  Effective weight loss requires both increased activity and changes in diet.  Often we are eating more calories than we realize through 'hidden' calories.  Keep your eye out for them.   I would like you to pick at least two of the below changes (it has to be something you are not already doing.)    1. Choose water or calorie free drinks instead of juice and soda.  2. Avoid the use of high calorie sauces, spreads (for example butter).  3. Choose to bake or steam instead of fry foods.  4. Pick lower fat and calorie proteins for example fish or chicken instead of fattier cuts of beef and pork.  5.  Cut out the sweets and other high calorie treats.  6.  Fill your plate at least half way with vegetables or fruit first before you put other things on your plate.    You may want to check out the free weight loss website http://www.choosemyplate.gov/

## 2018-07-05 NOTE — Progress Notes (Signed)
Pt received Td Vaccine in left deltoid per dr Marsa Aris, tolerated well

## 2018-07-08 NOTE — Telephone Encounter (Signed)
SCHEDULED

## 2018-07-10 ENCOUNTER — Other Ambulatory Visit: Payer: Self-pay

## 2018-07-10 ENCOUNTER — Ambulatory Visit
Admission: RE | Admit: 2018-07-10 | Discharge: 2018-07-10 | Disposition: A | Discharge status: Home / Self Care | Payer: Commercial Managed Care - PPO | Source: Ambulatory Visit | Attending: Cardiology | Admitting: Cardiology

## 2018-07-10 DIAGNOSIS — R011 Cardiac murmur, unspecified: Secondary | ICD-10-CM | POA: Insufficient documentation

## 2018-07-10 LAB — ECHO COMPLETE
Aortic Arch Diameter: 2.6 cm
Aortic Diameter (mid tubular): 2.8 cm
Aortic Diameter (sinus of Valsalva): 3.1 cm
BMI: 36 kg/m2
BP Diastolic: 74 mmHg
BP Systolic: 128 mmHg
BSA: 1.93 m2
Deceleration Time - MV: 217 ms
E/A ratio: 1.01
EF: 65 %
Echo RV Stroke Work Index Estimate: 544 mmHg•mL/m2
Heart Rate: 91 {beats}/min
Height: 61 in
IVC Diameter: 1.7 cm
LA Diameter BSA Index: 1.6 cm/m2
LA Diameter Height Index: 1.9 cm/m
LA Diameter: 3 cm
LA Systolic Vol BSA Index: 18.1 mL/m2
LA Systolic Vol Height Index: 22.6 mL/m
LA Systolic Volume: 35 mL
LV ASE Mass BSA Index: 78.4 gm/m2
LV ASE Mass Height 2.7 Index: 46.4 gm/m2.7
LV ASE Mass Height Index: 97.6 gm/m
LV ASE Mass: 151.3 gm
LV CO BSA Index: 3.96 L/min/m2
LV Cardiac Output: 7.64 L/min
LV Diastolic Volume Index: 62.7 mL/m2
LV Posterior Wall Thickness: 1.1 cm
LV SV BSA Index: 43.5 mL/m2
LV SV Height Index: 54.2 mL/m
LV Septal Thickness: 1.1 cm
LV Stroke Volume: 84 mL
LV Systolic Volume Index: 19.2 mL/m2
LVED Diameter BSA Index: 2.1 cm/m2
LVED Diameter Height Index: 2.6 cm/m
LVED Diameter: 4.1 cm
LVED Volume BSA Index: 62.7 mL/m2
LVED Volume BSA Index: 63 ml/m2
LVED Volume Height Index: 78.1 mL/m
LVED Volume: 121 mL
LVEF (Volume): 69 %
LVES Diameter BSA Index: 1.4 cm/m2
LVES Diameter Height Index: 1.7 cm/m
LVES Diameter: 2.7 cm
LVES Volume BSA Index: 19 ml/m2
LVES Volume BSA Index: 19.2 mL/m2
LVES Volume Height Index: 23.9 mL/m
LVES Volume: 37 mL
LVOT PWD VTI: 14.8 cm
LVOT PWD Velocity (mean): 63.9 cm/s
LVOT PWD Velocity (peak): 93.7 cm/s
MV Peak A Velocity: 61.8 cm/s
MV Peak E Velocity: 62.6 cm/s
Mitral Annular E/Ea Vel Ratio: 9.95
Mitral Annular Ea Velocity: 6.29 cm/s
Peak Gradient - TR: 12.5 mmHg
Peak Velocity - TR: 175.17 cm/s
Pulmonary Vascular Resistance Estimate: 1.6 mmHg
RA Diameter (Medial-Lateral) BSA Index: 1.7 cm/m2
RA Diameter (Medial-Lateral) Height Index: 2.1 cm/m
RA Diameter (Medial-Lateral): 3.2 cm
RA Pressure Estimate: 5 mmHg
RR Interval: 659.34 ms
RV Peak Systolic Pressure: 17.5 mmHg
RVED Diameter BSA Index: 1.8 cm/m2
RVED Diameter Height Index: 2.2 cm/m
RVED Diameter: 3.43 cm
Weight (lbs): 190 [lb_av]
Weight: 3040 oz

## 2018-07-14 ENCOUNTER — Encounter: Payer: Self-pay | Admitting: Primary Care

## 2018-07-18 ENCOUNTER — Telehealth: Payer: Self-pay | Admitting: Primary Care

## 2018-07-18 NOTE — Telephone Encounter (Signed)
Called pt - stated he just woke from 1 hr nap "feeling better" BP now 151/95- do you still want him to go to [facility] for evaluation

## 2018-07-18 NOTE — Telephone Encounter (Signed)
Jolayne Haines (wife) called Patient  BP 166/114 today he is a little dizzy  He was just here before thanksgiving

## 2018-07-18 NOTE — Telephone Encounter (Signed)
Patient's wife is calling, to check the status of her call. Writer advised that the nurses ar rooming patient and trying to answer calls as quickly as possible, patients wife stated understanding

## 2018-07-18 NOTE — Telephone Encounter (Signed)
Per DR Marsa Aris, pt can observe BP for now- call office early next week with readings for review- if sx worsen he is to go to [facility] or ER for evaluation

## 2018-07-18 NOTE — Telephone Encounter (Signed)
Pt started not feeling well yesterday at work- felt tired- this am awoke with frontal headache, fatigued- checked BP @9AM  166/114- Called into work because he was not feeling well- denies blurred vision, numbness, chest pain or nausea or swelling BP on recheck 168/110- advised to rest with feet elevated- drink glass of ater until he hears back from office

## 2018-07-18 NOTE — Telephone Encounter (Signed)
Go to urgent care for an evalution

## 2018-07-19 ENCOUNTER — Other Ambulatory Visit: Payer: Self-pay | Admitting: Cardiology

## 2018-07-19 ENCOUNTER — Emergency Department
Admission: EM | Admit: 2018-07-19 | Discharge: 2018-07-19 | Disposition: A | Discharge status: Home / Self Care | Payer: Commercial Managed Care - PPO | Source: Ambulatory Visit | Attending: Emergency Medicine | Admitting: Emergency Medicine

## 2018-07-19 ENCOUNTER — Encounter: Payer: Self-pay | Admitting: Emergency Medicine

## 2018-07-19 DIAGNOSIS — R51 Headache: Secondary | ICD-10-CM | POA: Insufficient documentation

## 2018-07-19 DIAGNOSIS — I499 Cardiac arrhythmia, unspecified: Secondary | ICD-10-CM

## 2018-07-19 DIAGNOSIS — R519 Headache, unspecified: Secondary | ICD-10-CM

## 2018-07-19 LAB — BASIC METABOLIC PANEL
Anion Gap: 12 (ref 7–16)
CO2: 25 mmol/L (ref 20–28)
Calcium: 9.8 mg/dL (ref 8.6–10.2)
Chloride: 105 mmol/L (ref 96–108)
Creatinine: 1.08 mg/dL (ref 0.67–1.17)
GFR,Black: 88 *
GFR,Caucasian: 76 *
Glucose: 95 mg/dL (ref 60–99)
Lab: 8 mg/dL (ref 6–20)
Potassium: 4.3 mmol/L (ref 3.3–5.1)
Sodium: 142 mmol/L (ref 133–145)

## 2018-07-19 LAB — CBC AND DIFFERENTIAL
Baso # K/uL: 0 10*3/uL (ref 0.0–0.1)
Basophil %: 0.4 %
Eos # K/uL: 0.1 10*3/uL (ref 0.0–0.5)
Eosinophil %: 0.7 %
Hematocrit: 38 % — ABNORMAL LOW (ref 40–51)
Hemoglobin: 12 g/dL — ABNORMAL LOW (ref 13.7–17.5)
IMM Granulocytes #: 0 10*3/uL
IMM Granulocytes: 0.4 %
Lymph # K/uL: 1.5 10*3/uL (ref 1.3–3.6)
Lymphocyte %: 21.3 %
MCH: 29 pg (ref 26–32)
MCHC: 32 g/dL (ref 32–37)
MCV: 90 fL (ref 79–92)
Mono # K/uL: 0.6 10*3/uL (ref 0.3–0.8)
Monocyte %: 8 %
Neut # K/uL: 4.9 10*3/uL (ref 1.8–5.4)
Nucl RBC # K/uL: 0 10*3/uL (ref 0.0–0.0)
Nucl RBC %: 0 /100 WBC (ref 0.0–0.2)
Platelets: 291 10*3/uL (ref 150–330)
RBC: 4.2 MIL/uL — ABNORMAL LOW (ref 4.6–6.1)
RDW: 15.1 % — ABNORMAL HIGH (ref 11.6–14.4)
Seg Neut %: 69.2 %
WBC: 7.1 10*3/uL (ref 4.2–9.1)

## 2018-07-19 LAB — HM HIV SCREENING OFFERED

## 2018-07-19 MED ORDER — ACETAMINOPHEN 325 MG PO TABS *I*
975.0000 mg | ORAL_TABLET | Freq: Once | ORAL | Status: AC
Start: 2018-07-19 — End: 2018-07-19
  Administered 2018-07-19: 975 mg via ORAL
  Filled 2018-07-19: qty 3

## 2018-07-19 NOTE — ED Triage Notes (Signed)
Pt present to the ED with complaints of a headache and high B/P. Pt states that his B/P at home was 177/112. Pt BP is 145/80. Pt states that the headache started at about 8 this morning. Pt did not take any meds this Am. Pt denies any fever N/V or chills.        Triage Note   Bubba Hales

## 2018-07-19 NOTE — Discharge Instructions (Addendum)
You were seen in the ED for headache and high BP. Repeat BP in the ED improved without intervention. Exam, Labs and EKG unremarkable    You should follow up with your PCP.     You should continue to take your home medications as prescribed.    Please return to the ED should you have any further acute medical problems or any worsening symptoms, especially sensitivity to light/sound, nausea/vomiting, vision changes, slurred speech, weakness, syncope, ataxia, dizziness, fever or neck stiffness.    Should you have any further questions about your care or evaluation you should call or make an appointment with your primary physician to review them.

## 2018-07-19 NOTE — ED Provider Notes (Signed)
History     Chief Complaint   Patient presents with    Headache     Pt is a 57yoAAM with PMH signficant for Obesity, Asthma, High cholesterol, Vit D def who presents with HA since 8 am. Report HA is frontal, not maximal or sudden in onset, mild at this time. Reports recent stress. States he checked his BP this am with his wife's wrist BP cuff and it was 177/122, but was 145/80 at triage. Denies hx of HTN and states he has not taken his meds this am. Denies sensitivity to light/sound, nausea/vomiting, vision changes, slurred speech, weakness, syncope, ataxia, dizziness, fever, chills, CP, SOB, neck pain at this time.      History provided by:  Patient  Language interpreter used: No        Medical/Surgical/Family History     Past Medical History:   Diagnosis Date    Asthma     Colonic polyp tubular adenoma next scope due 2022 November  12/27/2016    2019 colonoscopy  FINAL DIAGNOSIS:  (A) Cecum, polyp, biopsy:    - Tubular adenoma.  (B) Colon, right, polyps, biopsy:    - Tubular adenomas.  (C) Colon, transverse, polyps, biopsy,    - Tubular adenomas.    52yr ago  Surgical Pathology  FINAL DIAGNOSIS: Colon, cecum, biopsy: - Tubular adenoma.       Esophageal reflux 01/29/2006    Created by Conversion     High cholesterol 04/04/2007    Lower GI bleed 06/2018    After colonoscopy with polypectomy     Vitamin D deficiency 05/05/2008        Patient Active Problem List   Diagnosis Code    Allergic rhinitis J30.9    Asthma J45.909    High cholesterol E78.00    Vitamin D deficiency E55.9    Health care maintenance Z00.00    Colonic polyp tubular adenoma next scope due 2022 November  K63.5    Obesity, unspecified E66.9            Past Surgical History:   Procedure Laterality Date    ENDOSCOPY, COLON, WITH POLYPECTOMY [phone]  06/28/2018         LIPOMA RESECTION      SHOULDER SURGERY Right      Family History   Problem Relation Age of Onset    High Blood Pressure Mother     Diabetes Mother      Dementia Mother     No Known Problems Father     High Blood Pressure Sister     High Blood Pressure Sister     Diabetes Sister     High Blood Pressure Sister     High Blood Pressure Sister     High Blood Pressure Sister     Colon cancer Neg Hx     Colon polyps Neg Hx           Social History     Tobacco Use    Smoking status: Never Smoker    Smokeless tobacco: Never Used   Substance Use Topics    Alcohol use: Yes     Comment: 1 beer every few weeks    Drug use: No     Living Situation     Questions Responses    Patient lives with     Homeless     Caregiver for other family member     Financial risk analyst     Employment     Domestic Violence  Risk                 Review of Systems   Review of Systems   Constitutional: Negative for chills and fever.   Eyes: Negative for photophobia, pain, discharge, redness, itching and visual disturbance.   Respiratory: Negative for shortness of breath.    Cardiovascular: Negative for chest pain.   Gastrointestinal: Negative for abdominal pain, diarrhea, nausea and vomiting.   Genitourinary: Negative for dysuria.   Musculoskeletal: Negative for back pain and myalgias.   Neurological: Positive for headaches. Negative for dizziness, tremors, seizures, syncope, facial asymmetry, speech difficulty, weakness, light-headedness and numbness.   All other systems reviewed and are negative.      Physical Exam     Triage Vitals  Triage Start: Start, (07/19/18 9147)   First Recorded BP: 145/80, Resp: 18, Temp: 35.9 C (96.6 F), Temp src: TEMPORAL Oxygen Therapy SpO2: 100 %, Oximetry Source: Rt Hand, O2 Device: None (Room air), Heart Rate: 103, (07/19/18 0933)  .  First Pain Reported  0-10 Scale: 6, Pain Location/Orientation: Head, (07/19/18 8295)       Physical Exam  Vitals signs and nursing note reviewed.   Constitutional:       General: He is not in acute distress.     Appearance: He is well-developed.   HENT:      Head: Normocephalic and atraumatic.   Eyes:      Pupils: Pupils are  equal, round, and reactive to light.   Neck:      Musculoskeletal: Normal range of motion and neck supple.   Cardiovascular:      Rate and Rhythm: Normal rate and regular rhythm.      Pulses: Normal pulses.      Heart sounds: Normal heart sounds.   Pulmonary:      Effort: Pulmonary effort is normal.      Breath sounds: Normal breath sounds.   Abdominal:      General: Bowel sounds are normal.      Palpations: Abdomen is soft.   Musculoskeletal: Normal range of motion.   Skin:     General: Skin is warm and dry.   Neurological:      General: No focal deficit present.      Mental Status: He is alert and oriented to person, place, and time. Mental status is at baseline.      Comments: No aphasia or dysarthria  Symmetrical smile, tongue midline  Strength 5/5 and symmetric in bilateral upper and lower extremities  Sensation grossly intact to light touch over bilateral upper and lower extremities  Ambulates with a steady gait         Medical Decision Making   Patient seen by me on:  07/19/2018    Assessment:  57yoAAM with PMH signficant for Obesity, Asthma, High cholesterol, Vit D def who presents with HA since 8 am.    Differential diagnosis:    Tension headache  Migraine headache  Cluster headache  Electrolyte abnormality  Asymptomatic HTN  Hypertensive emergency/urgency        Plan:    Orders Placed This Encounter      CBC and differential      Basic metabolic panel      EKG 12 lead (initial)      HM HIV SCREENING OFFERED    Medications  acetaminophen (TYLENOL) tablet 975 mg (975 mg Oral Given 07/19/18 1020)  Discussed result with pt. Will discharge patient home with PCP follow-up.  Patient educated on  reasons to return to the ED.  Patient understands and agrees with plan.    Independent review of: Existing labs, chart/prior records      Labs Reviewed   CBC AND DIFFERENTIAL - Abnormal; Notable for the following components:       Result Value    RBC 4.2 (*)     Hemoglobin 12.0 (*)     Hematocrit 38 (*)     RDW 15.1 (*)      All other components within normal limits   BASIC METABOLIC PANEL               [place], PA          [place], [place], [place]  07/19/18 1453

## 2018-07-19 NOTE — ED Notes (Signed)
Patient reports having problems with BP where he has taken BP at home with elevated 170/100. When  He reports having problems when he gets up and moving around and is usually accompanied with headache. Currently has headache and dizziness. Came in for evaluation.   Past Medical History:   Diagnosis Date    Asthma     Colonic polyp tubular adenoma next scope due 2022 November  12/27/2016    2019 colonoscopy  FINAL DIAGNOSIS:  (A) Cecum, polyp, biopsy:    - Tubular adenoma.  (B) Colon, right, polyps, biopsy:    - Tubular adenomas.  (C) Colon, transverse, polyps, biopsy,    - Tubular adenomas.    47yr ago  Surgical Pathology  FINAL DIAGNOSIS: Colon, cecum, biopsy: - Tubular adenoma.       Esophageal reflux 01/29/2006    Created by Conversion     High cholesterol 04/04/2007    Lower GI bleed 06/2018    After colonoscopy with polypectomy     Vitamin D deficiency 05/05/2008     BP 145/80 (BP Location: Right arm)    Pulse 103    Temp 35.9 C (96.6 F) (Temporal)    Resp 18    Ht 1.549 m (5\' 1" )    Wt 87.1 kg (192 lb)    SpO2 100%    BMI 36.28 kg/m   Kyra Searles, RN

## 2018-07-22 ENCOUNTER — Telehealth: Payer: Self-pay | Admitting: Primary Care

## 2018-07-22 NOTE — Telephone Encounter (Signed)
Left message for pt to call back. Per GN offer hospital follow up in 1 wk with NP

## 2018-07-22 NOTE — Telephone Encounter (Signed)
Patient called back, stated that he could come on 12/19, writer booked him a follow up to his hospital discharge on 12/19 at 11:20 am with [facility]

## 2018-07-23 ENCOUNTER — Telehealth: Payer: Self-pay | Admitting: Primary Care

## 2018-07-23 NOTE — Telephone Encounter (Signed)
Called and spoke to pts wife- pt at work- asked how pt was feeling since visit to [facility] and to get a BP reading to make sure it's not running high- wife will have pt check his BP this evening and call office to report reading in am

## 2018-07-24 ENCOUNTER — Telehealth: Payer: Self-pay | Admitting: Primary Care

## 2018-07-24 NOTE — Telephone Encounter (Signed)
See new phone note with recent BP readings

## 2018-07-24 NOTE — Telephone Encounter (Signed)
Patient's wife is calling to give a couple BP readings to the nurse. 12/10 134/89 at 5:30 pm and today 12/11 at 6:19 am was 137/89

## 2018-07-24 NOTE — Telephone Encounter (Signed)
These are follow up BP readings after pt was seen at [facility] over weekend

## 2018-07-24 NOTE — Telephone Encounter (Signed)
Looks good see [place] next week as planned

## 2018-07-25 LAB — EKG 12-LEAD
P: 30 deg
PR: 193 ms
QRS: 9 deg
QRSD: 90 ms
QT: 344 ms
QTc: 392 ms
Rate: 78 {beats}/min
T: -6 deg

## 2018-08-01 ENCOUNTER — Ambulatory Visit: Payer: Commercial Managed Care - PPO | Attending: Nurse Practitioner | Admitting: Nurse Practitioner

## 2018-08-01 ENCOUNTER — Encounter: Payer: Self-pay | Admitting: Nurse Practitioner

## 2018-08-01 VITALS — BP 120/70 | HR 90 | Ht 61.0 in | Wt 194.0 lb

## 2018-08-01 DIAGNOSIS — Z09 Encounter for follow-up examination after completed treatment for conditions other than malignant neoplasm: Secondary | ICD-10-CM

## 2018-08-01 DIAGNOSIS — R51 Headache: Secondary | ICD-10-CM

## 2018-08-01 DIAGNOSIS — Z566 Other physical and mental strain related to work: Secondary | ICD-10-CM

## 2018-08-01 DIAGNOSIS — R519 Headache, unspecified: Secondary | ICD-10-CM

## 2018-08-01 NOTE — Progress Notes (Signed)
Patient Name: Carl Olson   Patient DOB: March 17, 1961   Patient MR#: [id number]       Subjective     Chief Complaint   Patient presents with    Hospital F/U     Patient is 57 yo with PMH of Obesity,asthma,high cholesterol, vitamin D deficiency who came in for discharge follow up from ED on 07/19/18 due to headache. He was told he had migraine due to stress. He states he feels fine since his off from work. He denies headache, blurry vision, nausea,vomiting, fever, dizziness and weakness. 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 Pt took time off from work and not going back till January.   Pt does not smoke, drinks beer once in couple weeks, things are stable at home at this time, his HA's resolved too. Pt checks BP at home and it is normal. Pt is UTD on PE, had influenza shot this season.          I have reviewed vital signs, allergies, medications, past medical/surgical history during this visit.     Allergies   Allergen Reactions    Moxifloxacin Other (See Comments)     Penile glans swelling       Past Medical History:   Diagnosis Date    Asthma     Colonic polyp tubular adenoma next scope due 2022 November  12/27/2016    2019 colonoscopy  FINAL DIAGNOSIS:  (A) Cecum, polyp, biopsy:    - Tubular adenoma.  (B) Colon, right, polyps, biopsy:    - Tubular adenomas.  (C) Colon, transverse, polyps, biopsy,    - Tubular adenomas.    76yr ago  Surgical Pathology  FINAL DIAGNOSIS: Colon, cecum, biopsy: - Tubular adenoma.       Esophageal reflux 01/29/2006    Created by Conversion     High cholesterol 04/04/2007    Lower GI bleed 06/2018    After colonoscopy with polypectomy     Vitamin D deficiency 05/05/2008     Past Surgical History:   Procedure Laterality Date    ENDOSCOPY, COLON, WITH POLYPECTOMY [phone]  06/28/2018         LIPOMA RESECTION      SHOULDER SURGERY Right       Social History     Tobacco Use    Smoking status: Never Smoker    Smokeless  tobacco: Never Used   Substance Use Topics    Alcohol use: Yes     Comment: 1 beer every few weeks    Drug use: No     Patient Active Problem List   Diagnosis Code    Allergic rhinitis J30.9    Asthma J45.909    High cholesterol E78.00    Vitamin D deficiency E55.9    Health care maintenance Z00.00    Colonic polyp tubular adenoma next scope due 2022 November  K63.5    Obesity, unspecified E66.9       Review of Systems   Constitutional: Negative.    HENT: Negative.    Eyes: Negative.    Respiratory: Negative.    Cardiovascular: Negative.    Musculoskeletal: Negative.    Skin: Negative.    Neurological: Negative.  Negative for dizziness, tingling and headaches.   Endo/Heme/Allergies: Negative.    Psychiatric/Behavioral: Negative.        Vitals:    08/01/18 1105   BP: 120/70   BP Location: Left arm   Patient Position:  Sitting   Cuff Size: large adult   Pulse: 90   SpO2: 98%   Weight: 88 kg (194 lb)   Height: 1.549 m (5\' 1" )       Physical Exam  Vitals signs and nursing note reviewed.   Constitutional:       General: He is not in acute distress.     Appearance: Normal appearance. He is not ill-appearing.   HENT:      Head: Normocephalic and atraumatic.      Right Ear: Tympanic membrane normal. There is no impacted cerumen.      Left Ear: Tympanic membrane normal. There is no impacted cerumen.      Nose: Nose normal. No congestion.      Mouth/Throat:      Mouth: Mucous membranes are moist.   Eyes:      Conjunctiva/sclera: Conjunctivae normal.   Neck:      Musculoskeletal: Normal range of motion and neck supple.   Cardiovascular:      Rate and Rhythm: Normal rate and regular rhythm.      Heart sounds: Normal heart sounds.   Pulmonary:      Effort: Pulmonary effort is normal.      Breath sounds: Normal breath sounds.   Musculoskeletal: Normal range of motion.         General: No tenderness.      Right lower leg: No edema.      Left lower leg: No edema.   Skin:     General: Skin is warm and dry.   Neurological:       General: No focal deficit present.      Mental Status: He is alert and oriented to person, place, and time.      Motor: Motor function is intact.      Coordination: Romberg sign negative.      Gait: Gait is intact. Tandem walk normal.   Psychiatric:         Mood and Affect: Mood normal.         Behavior: Behavior normal.         Thought Content: Thought content normal.         Judgment: Judgment normal.              Medications:   Current Outpatient Medications   Medication Sig    montelukast (SINGULAIR) 10 MG tablet Take 1 tablet (10 mg total) by mouth nightly as needed (asthma and allergies)    atorvastatin (LIPITOR) 40 MG tablet Take 1 tablet (40 mg total) by mouth daily    cetirizine (ZYRTEC) 10 MG tablet Take 10 mg by mouth daily    mometasone (NASONEX) 50 MCG/ACT nasal spray instill 2 sprays into each nostril once daily    albuterol HFA 108 (90 Base) MCG/ACT inhaler Inhale 1-2 puffs into the lungs every 6 hours as needed for Wheezing   Shake well before each use.    budesonide (PULMICORT FLEXHALER) 180 MCG/ACT flexhaler Inhale 1 puff into the lungs 2 times daily    cholecalciferol (VITAMIN D) 2000 units tablet Take 1 tablet (2,000 Units total) by mouth daily    Calcium Carbonate-Vitamin D (CALTRATE 600+D) 600-400 MG-UNIT per tablet TAKE 1 TABLET TWICE DAILY (Patient taking differently: Take 1 tablet by mouth daily   )       Recent Lab Results     Lab Results   Component Value Date    NA 142 07/19/2018    K 4.3 07/19/2018  CL 105 07/19/2018    CO2 25 07/19/2018    UN 8 07/19/2018    CREAT 1.08 07/19/2018    VID25 28 (L) 06/08/2018    WBC 7.1 07/19/2018    HGB 12.0 (L) 07/19/2018    HCT 38 (L) 07/19/2018    PLT 291 07/19/2018    TSH 1.62 01/22/2016    HA1C 6.0 12/28/2016    CHOL 189 06/08/2018    TRIG 108 06/08/2018    HDL 54 06/08/2018    LDLC 113 06/08/2018    CHHDC 3.5 06/08/2018       Assessment and Plan     Patient is 57 yo with PMH of Obesity,asthma, high cholesterol, vitamin D deficiency who  came in for discharge follow up from ED due to headache and elevated BP. His symptoms resolved at this time with taking time off from work. He has normal exam and stable blood pressure. I advised pt to monitor blood pressure at home periodically.     Continue his routine meds for:     Asthma. He has no cough and wheezing. Continue his asthma medication montelukast, Budesonide, and albuterol    High cholesterol. Continue Atorvastatin.    Vitamin deficiency: Continue Vit. D tablet    Follow up    Follow up within 3-6 months. Return to clinic if with symptoms not resolving.    Provider Signature:   Pete Glatter, NP     Date: 08/01/2018 Time:   12:13 PM

## 2018-08-01 NOTE — Patient Instructions (Addendum)
You were seen for ED follow up for headache and elevated blood pressure that have resolved at this time  You report it was due to stress at home and work  You have taken time off from work and this has helped     Follow up in 2-3 months and as need it

## 2018-08-26 ENCOUNTER — Other Ambulatory Visit
Admission: RE | Admit: 2018-08-26 | Discharge: 2018-08-26 | Disposition: A | Discharge status: Home / Self Care | Payer: Commercial Managed Care - PPO | Source: Ambulatory Visit | Attending: Primary Care | Admitting: Primary Care

## 2018-08-26 ENCOUNTER — Encounter: Payer: Self-pay | Admitting: Primary Care

## 2018-08-26 DIAGNOSIS — K922 Gastrointestinal hemorrhage, unspecified: Secondary | ICD-10-CM | POA: Insufficient documentation

## 2018-08-26 DIAGNOSIS — D649 Anemia, unspecified: Secondary | ICD-10-CM | POA: Insufficient documentation

## 2018-08-26 DIAGNOSIS — R000 Tachycardia, unspecified: Secondary | ICD-10-CM | POA: Insufficient documentation

## 2018-08-26 DIAGNOSIS — Z0000 Encounter for general adult medical examination without abnormal findings: Secondary | ICD-10-CM

## 2018-08-26 DIAGNOSIS — E7800 Pure hypercholesterolemia, unspecified: Secondary | ICD-10-CM

## 2018-08-26 LAB — LIPID PANEL
Chol/HDL Ratio: 2.9
Cholesterol: 140 mg/dL
HDL: 49 mg/dL (ref 40–60)
LDL Calculated: 77 mg/dL
Non HDL Cholesterol: 91 mg/dL
Triglycerides: 72 mg/dL

## 2018-08-26 LAB — CBC
Hematocrit: 47 % (ref 40–51)
Hemoglobin: 14.9 g/dL (ref 13.7–17.5)
MCH: 29 pg/cell (ref 26–32)
MCHC: 32 g/dL (ref 32–37)
MCV: 90 fL (ref 79–92)
Platelets: 256 10*3/uL (ref 150–330)
RBC: 5.2 MIL/uL (ref 4.6–6.1)
RDW: 12.8 % (ref 11.6–14.4)
WBC: 8.1 10*3/uL (ref 4.2–9.1)

## 2018-08-26 LAB — TSH: TSH: 1.44 u[IU]/mL (ref 0.27–4.20)

## 2018-09-12 ENCOUNTER — Other Ambulatory Visit: Payer: Commercial Managed Care - PPO | Admitting: Primary Care

## 2018-09-25 ENCOUNTER — Telehealth: Payer: Self-pay | Admitting: Primary Care

## 2018-09-25 MED ORDER — MOMETASONE FUROATE 50 MCG/ACT NA SUSP *I*
2.0000 | Freq: Every day | NASAL | 5 refills | Status: DC
Start: 2018-09-25 — End: 2018-11-25

## 2018-09-25 NOTE — Telephone Encounter (Signed)
Additional message:    Last office visit at [facility]:   08/01/2018  Patients upcoming appointments:  Future Appointments   Date Time Provider [place]   06/09/2019  1:20 PM Caroll Rancher, MD [facility] None     Recent Lab results:  GENERAL CHEMISTRY   Recent Labs     07/19/18  1041 06/29/18  0430 06/08/18  0849   NA 142 141 143   K 4.3 5.1 4.7   CL 105 103 102   CO2 25 26 27    GAP 12 12 14    UN 8 13 11    CREAT 1.08 1.26* 1.22*   GFRC 76 63 66   GFRB 88 73 76   GLU 95 132* 100*   CA 9.8 9.3 9.7      LIPID PROFILE   Recent Labs     08/26/18  0826 06/08/18  0849   CHOL 140 189   TRIG 72 108   HDL 49 54   LDLC 77 113      LIVER PROFILE   No value within the past 365 days   DIABETES THYROID   No value within the past 365 days Recent Labs     08/26/18  0826   TSH 1.44         Pending/Orders Labs:  Lab Frequency Next Occurrence   Basic metabolic panel Once 67/34/1937   Lipid Panel (Reflex to Direct  LDL if Triglycerides more than 400) Once 07/05/2019   Vitamin D Once 07/05/2019   PSA (eff.11-2008) Once 07/05/2019

## 2018-11-25 ENCOUNTER — Ambulatory Visit: Payer: Commercial Managed Care - PPO | Attending: Primary Care | Admitting: Primary Care

## 2018-11-25 ENCOUNTER — Telehealth: Payer: Self-pay | Admitting: Primary Care

## 2018-11-25 DIAGNOSIS — J029 Acute pharyngitis, unspecified: Secondary | ICD-10-CM

## 2018-11-25 DIAGNOSIS — R05 Cough: Secondary | ICD-10-CM

## 2018-11-25 DIAGNOSIS — R059 Cough, unspecified: Secondary | ICD-10-CM

## 2018-11-25 DIAGNOSIS — J3089 Other allergic rhinitis: Secondary | ICD-10-CM

## 2018-11-25 MED ORDER — MOMETASONE FUROATE 50 MCG/ACT NA SUSP *I*
2.0000 | Freq: Every day | NASAL | 5 refills | Status: DC
Start: 2018-11-25 — End: 2020-03-29

## 2018-11-25 NOTE — Telephone Encounter (Signed)
Pt is calling with complaints of a cough and sore throat for about two weeks. Stated the cough has colorful phlegm. No fever, or body aches. States it may be his allergies.

## 2018-11-25 NOTE — Telephone Encounter (Signed)
Patient agreed to zoom appt at 3pm today with PCP

## 2018-11-25 NOTE — NoShare Progress Note (Signed)
Patient ID: Carl Olson is a 58 y.o. year old male who presents today forNo chief complaint on file.    There were no vitals taken for this visit.  Estimated body mass index is 36.66 kg/m as calculated from the following:    Height as of 08/01/18: 1.549 m (5\' 1" ).    Weight as of 08/01/18: 88 kg (194 lb).    SUBJECTIVE        Due to pandemic event, visit performed via:        Video   Location of Patient: home  Location of Telemedicine Provider: clinical office   Other participants in telemedicine encounter and roles: n/a    Consent was obtained from the patient to complete this video visit; including the potential for financial liability.    This visit was performed during a pandemic event and the vital signs including BMI and physical exam are limited or missing due to the patient's location.               He has had cough and sore throat for about two weeks.  Cough is productive of 'colorful' phlegm.  Cough is getting better.  At its worst about 6 days ago, better around 4 days ago.    He was using OTC cough syrup.    This is similar to bronchitis symptoms he had about 2 years ago but at htat time no sore throat.  However this time it is getting better on his own.  Worst part now is his sore throat.   He is using albuterol once a day    ROS no fevers no chills no SOB no chest pain pe se but did have rib pain from the coughing.   He did have rhinitis.     Medications and problem list reviewed   during visit     Allergy / Social History / Medications:     Allergies   Allergen Reactions    Moxifloxacin Other (See Comments)     Penile glans swelling     Family History   Problem Relation Age of Onset    High Blood Pressure Mother     Diabetes Mother     Dementia Mother     No Known Problems Father     High Blood Pressure Sister     High Blood Pressure Sister     Diabetes Sister     High Blood Pressure Sister     High Blood Pressure Sister     High Blood Pressure Sister     Colon cancer Neg Hx     Colon  polyps Neg Hx      Past Medical History:   Diagnosis Date    Asthma     Colonic polyp tubular adenoma next scope due 2022 November  12/27/2016    2019 colonoscopy  FINAL DIAGNOSIS:  (A) Cecum, polyp, biopsy:    - Tubular adenoma.  (B) Colon, right, polyps, biopsy:    - Tubular adenomas.  (C) Colon, transverse, polyps, biopsy,    - Tubular adenomas.    73yr ago  Surgical Pathology  FINAL DIAGNOSIS: Colon, cecum, biopsy: - Tubular adenoma.       Esophageal reflux 01/29/2006    Created by Conversion     High cholesterol 04/04/2007    Lower GI bleed 06/2018    After colonoscopy with polypectomy     Vitamin D deficiency 05/05/2008         Social History     Social  History Narrative    Married, two step daughters.  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           Social History     Tobacco Use    Smoking status: Never Smoker    Smokeless tobacco: Never Used   Substance Use Topics    Alcohol use: Yes     Comment: 1 beer every few weeks             OBJECTIVE   There were no vitals filed for this visit.    General Appearance: Awake, alert, No distress     Pharynx -  With aid off a flashlight and using his cell phone camera we were able to tattempt ot visualize his throat and while I can  Not see posterior pharynx well there does not appear to be any erythema or discharge of visualized pharynx   Psych:  Good historian, appropriate eye contact, normal affect      Recent Labs and Other Results     Lab Results   Component Value Date    NA 142 07/19/2018    K 4.3 07/19/2018    CL 105 07/19/2018    CO2 25 07/19/2018    UN 8 07/19/2018    CREAT 1.08 07/19/2018    VID25 28 (L) 06/08/2018    WBC 8.1 08/26/2018    HGB 14.9 08/26/2018    HCT 47 08/26/2018    PLT 256 08/26/2018    TSH 1.44 08/26/2018    CHOL 140 08/26/2018    TRIG 72 08/26/2018    HDL 49 08/26/2018    LDLC 77 08/26/2018    CHHDC 2.9 08/26/2018     Hemoglobin A1C   Date  Value Ref Range Status   12/28/2016 6.0 4.0 - 6.0 % Final     Comment:                                   NON-DIABETIC             ASSESSMENT AND PLAN   No diagnosis found.        Cough sore throat in setting of allergies I think this is benign allergies given time course and symptoms doubt coronavirus.   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  Treat allergies refills sent.           Orders and Meds       Outpatient Encounter Medications as of 11/25/2018   Medication Sig Dispense Refill    mometasone (NASONEX) 50 MCG/ACT nasal spray 2 sprays by Nasal route daily 17 g 5    PULMICORT FLEXHALER 180 MCG/ACT flexhaler INHALE 1 PUFF INTO THE LUNGS TWICE DAILY 1 each 3    montelukast (SINGULAIR) 10 MG tablet Take 1 tablet (10 mg total) by mouth nightly as needed (asthma and allergies) 30 tablet 5    atorvastatin (LIPITOR) 40 MG tablet Take 1 tablet (40 mg total) by mouth daily 90 tablet 3    cetirizine (ZYRTEC) 10 MG tablet Take 10 mg by mouth daily      albuterol HFA 108 (90 Base) MCG/ACT inhaler Inhale 1-2 puffs into the lungs every 6 hours as needed for Wheezing   Shake well before each use. 3 Inhaler 2    cholecalciferol (VITAMIN D) 2000  units tablet Take 1 tablet (2,000 Units total) by mouth daily 100 capsule 3    Calcium Carbonate-Vitamin D (CALTRATE 600+D) 600-400 MG-UNIT per tablet TAKE 1 TABLET TWICE DAILY (Patient taking differently: Take 1 tablet by mouth daily   ) 60 5     No facility-administered encounter medications on file as of 11/25/2018.          Signed: Caroll Rancher, MD on 11/25/2018 at 3:00 PM

## 2018-11-25 NOTE — Telephone Encounter (Signed)
Telemed visit

## 2018-11-25 NOTE — Telephone Encounter (Signed)
Do telemed visit me or NP

## 2019-04-10 ENCOUNTER — Other Ambulatory Visit: Payer: Self-pay | Admitting: Primary Care

## 2019-04-10 DIAGNOSIS — E7800 Pure hypercholesterolemia, unspecified: Secondary | ICD-10-CM

## 2019-04-10 MED ORDER — ATORVASTATIN CALCIUM 40 MG PO TABS *I*
40.0000 mg | ORAL_TABLET | Freq: Every day | ORAL | 3 refills | Status: DC
Start: 2019-04-10 — End: 2020-04-14

## 2019-04-10 NOTE — Telephone Encounter (Signed)
Additional message:    Last office visit at [facility]:   08/01/2018  Patients upcoming appointments:  Future Appointments   Date Time Provider Department Center   06/09/2019  1:20 PM Noronha, Gary J, MD [facility] None     Recent Lab results:  GENERAL CHEMISTRY   Recent Labs     07/19/18  1041 06/29/18  0430 06/08/18  0849   NA 142 141 143   K 4.3 5.1 4.7   CL 105 103 102   CO2 25 26 27   GAP 12 12 14   UN 8 13 11   CREAT 1.08 1.26* 1.22*   GFRC 76 63 66   GFRB 88 73 76   GLU 95 132* 100*   CA 9.8 9.3 9.7      LIPID PROFILE   Recent Labs     08/26/18  0826 06/08/18  0849   CHOL 140 189   TRIG 72 108   HDL 49 54   LDLC 77 113      LIVER PROFILE   No value within the past 365 days   DIABETES THYROID   No value within the past 365 days Recent Labs     08/26/18  0826   TSH 1.44         Pending/Orders Labs:  Lab Frequency Next Occurrence   Basic metabolic panel Once 07/05/2019   Lipid Panel (Reflex to Direct  LDL if Triglycerides more than 400) Once 07/05/2019   Vitamin D Once 07/05/2019   PSA (eff.11-2008) Once 07/05/2019

## 2019-05-15 ENCOUNTER — Ambulatory Visit: Payer: Commercial Managed Care - PPO

## 2019-05-15 DIAGNOSIS — Z23 Encounter for immunization: Secondary | ICD-10-CM

## 2019-05-15 NOTE — Progress Notes (Signed)
Pt received FLU vaccine in left deltoid per DR Noronha, tolerated well

## 2019-06-06 ENCOUNTER — Other Ambulatory Visit
Admission: RE | Admit: 2019-06-06 | Discharge: 2019-06-06 | Disposition: A | Discharge status: Home / Self Care | Payer: Commercial Managed Care - PPO | Source: Ambulatory Visit | Attending: Primary Care | Admitting: Primary Care

## 2019-06-06 DIAGNOSIS — Z0000 Encounter for general adult medical examination without abnormal findings: Secondary | ICD-10-CM | POA: Insufficient documentation

## 2019-06-06 DIAGNOSIS — E7800 Pure hypercholesterolemia, unspecified: Secondary | ICD-10-CM | POA: Insufficient documentation

## 2019-06-06 DIAGNOSIS — Z125 Encounter for screening for malignant neoplasm of prostate: Secondary | ICD-10-CM | POA: Insufficient documentation

## 2019-06-06 DIAGNOSIS — E559 Vitamin D deficiency, unspecified: Secondary | ICD-10-CM | POA: Insufficient documentation

## 2019-06-06 LAB — BASIC METABOLIC PANEL
Anion Gap: 9 (ref 7–16)
CO2: 30 mmol/L — ABNORMAL HIGH (ref 20–28)
Calcium: 10.3 mg/dL — ABNORMAL HIGH (ref 8.6–10.2)
Chloride: 103 mmol/L (ref 96–108)
Creatinine: 1.26 mg/dL — ABNORMAL HIGH (ref 0.67–1.17)
GFR,Black: 73 *
GFR,Caucasian: 63 *
Glucose: 94 mg/dL (ref 60–99)
Lab: 10 mg/dL (ref 6–20)
Potassium: 4.6 mmol/L (ref 3.3–5.1)
Sodium: 142 mmol/L (ref 133–145)

## 2019-06-07 LAB — LIPID PANEL
Chol/HDL Ratio: 2.9
Cholesterol: 160 mg/dL
HDL: 55 mg/dL (ref 40–60)
LDL Calculated: 92 mg/dL
Non HDL Cholesterol: 105 mg/dL
Triglycerides: 67 mg/dL

## 2019-06-07 LAB — VITAMIN D: 25-OH Vit Total: 37 ng/mL (ref 30–60)

## 2019-06-07 LAB — PSA (EFF.4-2010): PSA (eff. 4-2010): 1.75 ng/mL (ref 0.00–4.00)

## 2019-06-09 ENCOUNTER — Ambulatory Visit: Payer: Commercial Managed Care - PPO | Admitting: Primary Care

## 2019-06-09 ENCOUNTER — Encounter: Payer: Self-pay | Admitting: Primary Care

## 2019-06-09 ENCOUNTER — Encounter: Payer: Self-pay | Admitting: Gastroenterology

## 2019-06-09 VITALS — BP 136/86 | HR 94 | Temp 98.5°F | Ht 61.5 in | Wt 198.0 lb

## 2019-06-09 DIAGNOSIS — E7800 Pure hypercholesterolemia, unspecified: Secondary | ICD-10-CM

## 2019-06-09 DIAGNOSIS — J4540 Moderate persistent asthma, uncomplicated: Secondary | ICD-10-CM

## 2019-06-09 DIAGNOSIS — I10 Essential (primary) hypertension: Secondary | ICD-10-CM

## 2019-06-09 DIAGNOSIS — Z0000 Encounter for general adult medical examination without abnormal findings: Secondary | ICD-10-CM

## 2019-06-09 DIAGNOSIS — E559 Vitamin D deficiency, unspecified: Secondary | ICD-10-CM

## 2019-06-09 DIAGNOSIS — Z6836 Body mass index (BMI) 36.0-36.9, adult: Secondary | ICD-10-CM

## 2019-06-09 NOTE — Progress Notes (Signed)
Patient ID: Carl Olson is a 58 y.o. year old male who presents today forAnnual Exam    BP 136/86 (BP Location: Left arm, Patient Position: Sitting, Cuff Size: large adult)    Pulse 94    Temp 36.9 C (98.5 F) (Temporal)    Ht 1.562 m (5' 1.5")    Wt 89.8 kg (198 lb)    SpO2 99%    BMI 36.81 kg/m   Estimated body mass index is 36.81 kg/m as calculated from the following:    Height as of this encounter: 1.562 m (5' 1.5").    Weight as of this encounter: 89.8 kg (198 lb).    SUBJECTIVE            58 year old with history of high cholesterol, asthma, reflux, allergic rhinitis    Patient presents for preventive health care visit.      Asthma with allergies had flare in the spring he is having some 'throat irritation, it's not sore' he is using Singulair and is buying 365 tablets at a time  ROS no fevers no chills no wheezing no coughing     Obesity  He has gained some weight since last year.   Wt Readings from Last 4 Encounters:   06/09/19 89.8 kg (198 lb)   08/01/18 88 kg (194 lb)   07/19/18 87.1 kg (192 lb)   07/10/18 86.2 kg (190 lb)        Vitamin D he reports medication adherence     Murmur sent for echo last year     Seen  in ER for headache last year with elevated BP this resolved prior to follow up visit in our office     Medications, allergies, past medical history, social history reviewed and updated in electronic record and full ROS reviewed with patient Review of Systems -    He notes sore flanks the last week after unusually heavy labor     Review of Systems   Constitutional: Negative for chills and fever.   HENT: Positive for sore throat. Negative for congestion, ear pain, hearing loss, nosebleeds and tinnitus.    Eyes: Negative for blurred vision, double vision, pain and discharge.   Respiratory: Negative for cough, shortness of breath, wheezing and stridor.    Cardiovascular: Negative for chest pain, palpitations and leg swelling.   Gastrointestinal: Negative for abdominal pain, blood in stool,  constipation, diarrhea, heartburn, melena, nausea and vomiting.   Genitourinary: Negative for dysuria, frequency and urgency.   Musculoskeletal: Positive for joint pain and myalgias. Negative for falls and neck pain.   Skin: Negative for itching and rash.   Neurological: Negative for dizziness, tingling, tremors, seizures and headaches.   Endo/Heme/Allergies: Negative for environmental allergies and polydipsia. Does not bruise/bleed easily.   Psychiatric/Behavioral: Negative for depression, hallucinations, memory loss and suicidal ideas.        Allergy / Social History / Medications:     Allergies   Allergen Reactions    Moxifloxacin Other (See Comments)     Penile glans swelling     Family History   Problem Relation Age of Onset    High Blood Pressure Mother     Diabetes Mother     Dementia Mother     No Known Problems Father     High Blood Pressure Sister     High Blood Pressure Sister     Diabetes Sister     High Blood Pressure Sister     High Blood Pressure Sister  High Blood Pressure Sister     Colon cancer Neg Hx     Colon polyps Neg Hx      Past Medical History:   Diagnosis Date    Asthma     Colonic polyp tubular adenoma next scope due 2022 November  12/27/2016    2019 colonoscopy  FINAL DIAGNOSIS:  (A) Cecum, polyp, biopsy:    - Tubular adenoma.  (B) Colon, right, polyps, biopsy:    - Tubular adenomas.  (C) Colon, transverse, polyps, biopsy,    - Tubular adenomas.    [facility]  Surgical Pathology  FINAL DIAGNOSIS: Colon, cecum, biopsy: - Tubular adenoma.       Esophageal reflux 01/29/2006    Created by Conversion     High cholesterol 04/04/2007    Lower GI bleed 06/2018    After colonoscopy with polypectomy     Vitamin D deficiency 05/05/2008         Social History     Social History Narrative    Married, two step daughters.  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  [employer]atorfor [employer]  Cowboys football fan, enjoys casino's and lottery.    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  No acute distress.  Appears stated age.  Skin: No rash.  No atypical nevi.    Lymph nodes: No cervical, submandibular, supraclavicular, axillary, inguinal lymphadenopathy.  HEENT: Pupils symmetric.  Conjunctiva pink.  Sclera anicteric.  Turbinates without edema or erythema.  Oropharynx moist with no lesions.  TMs are shiny .  Neck: No thyromegaly or thyroid nodules. No other masses, no crepitus. No carotid bruits.  No jugular venous distention.  Chest/Breast:   Recumbent exams.  No mass or tenderness.  Skin without dimpling or retraction.  No nipple discharge.  Lungs: Clear to auscultation bilaterally, normal respiratory effort, no wheeze or rales   Cardiac:  Regular S1 and S2 with no murmur, rub, gallop.  PMI nondisplaced.  Abdomen: Soft, non tender, non distended.  No palpable HSM or mass.  Normal active bowel sounds.  Genitals: Testes descended bilaterally, no palpable mass or tenderness.  Penis without lesions or discharge.   Rectal/prostate: Prostate is non tender.  Prostate is not enlarged.  No palpable nodule.  No rectal mass.   .Marland Kitchen  Spine/musculoskeletal: Spine is non tender.  No joint deformities .  Good functional range of motion  Extremities: No clubbing, cyanosis, edema.  Vascular: 2+ radial, brachial, femoral, dorsalis pedis pulses bilaterally.  Neurologic: Patient is alert and oriented.  Sensation intact to light touch  Gait is without ataxia.      Psych:  Appropriate historian , normal affect and interaction, good eye contact.       Recent Labs and Other Results     Lab Results    Component  Value Date    NA 142 06/06/2019    K 4.6 06/06/2019    CL 103 06/06/2019    CO2 30 (H) 06/06/2019    UN 10 06/06/2019    CREAT 1.26 (H) 06/06/2019    VID25 37 06/06/2019    WBC 8.1 08/26/2018    HGB 14.9 08/26/2018    HCT 47 08/26/2018    PLT 256 08/26/2018    TSH 1.44 08/26/2018    CHOL 160 06/06/2019    TRIG 67 06/06/2019    HDL 55 06/06/2019    LDLC 92 06/06/2019    CHHDC 2.9 06/06/2019     Hemoglobin A1C   Date Value Ref Range Status   12/28/2016 6.0 4.0 - 6.0 % Final     Comment:                                   NON-DIABETIC     Echo 2019   Interpretation Summary     Normal LV size, mass and function with no regional wall motion abnormalities. The calculated LVEF is 60%   Normal diastolic function.   Normal RV size and function.   Aortic and mitral valve sclerosis without significant stenosis.    Estimated normal pulmonary artery systolic pressure.   No prior study is available for comparison.        EKG sinus axis positive rate 98 borderline prolonged PR 197 non specific ST changes         ASSESSMENT AND PLAN     1. Health care maintenance  Measles IgG antibody    Basic metabolic panel    Lipid Panel (Reflex to Direct  LDL if Triglycerides more than 400)    PSA (eff.11-2008)    Vitamin D   2. Essential hypertension  Basic metabolic panel   3. High cholesterol  Lipid Panel (Reflex to Direct  LDL if Triglycerides more than 400)   4. Moderate persistent asthma without complication     5. Vitamin D deficiency  Vitamin D   6. Class 2 severe obesity due to excess calories with serious comorbidity and body mass index (BMI) of 36.0 to 36.9 in adult                Health Maintenance  Preventive health guidelines reviewed with patient and highlighted particular issues  pertinent to their health, gender, and age.    Discussed preventive lab testing and immunizations. See discussion below and orders for particular items discussed at today's visit.      Items discussed include (but are not limited only  to) those indicated below     Health care proxy / advance directives    Reviewed on file   Lifestyle modification including diet and exercise -    Skin CA awareness/prevention discussed  --Sunscreen use / long sleeved clothing  --Monitor moles for the following changes:    A: asymmetry   B: border changes   C: color changes    D: diameter greater than a pencil eraser size    E: evolving, or changing mole       Seat belt use counseled     Gun safe storage review offered     Dentistry yearly advised    [place] every 1-2 years advised          Reviewed blood work on file / discussed additional blood tests  EKG for those with HTN/significant CVD risk factors or age > 3     Colon CA screening starting at 46     Due 2022       Prostate cancer screening  Advised to do   Testicular self exam discussed        Vaccinations:  Immunization History   Administered Date(s) Administered    Influenza Adult(51yrand up) 06/05/2011    Influenza Quadrivalent 0.573mprefilled syringe/single dose vial(FluLaval,Fluzone,Afluria,Fluarix) 06/28/2012, 07/20/2015, 07/13/2016, 06/29/2017, 06/30/2018, 05/15/2019    Influenza Whole 07/08/2007, 05/04/2008, 05/06/2009    Influenza multi-dose vial 06/12/2013    Pneumococcal Polysaccharide (Pneumovax) 06/29/2017    Td 07/05/2018    Tdap 03/02/2008       Yearly flu shot advised has done already      Shingrix vaccination discussed and advised for patients 5069nd older hold off during the pandemic        MMR or measles IGG for those born after 1956 don ext year        CV Risk stratification  Reviewed 2018 AHA cholesterol management guidelines with patient and discussed on my review how I think they apply to this patients care.      Lab Results   Component Value Date    CHOL 160 06/06/2019    HDL 55 06/06/2019      Risk calculation tool done and discussed with patient.    The 10-year ASCVD risk score (GMikey BussingC JrBrooke Bonito et al., 2013) is: 7.4%    Values used to calculate the score:      Age: [age]years      Sex: Male      Is Non-Hispanic African American: Yes      Diabetic: No      Tobacco smoker: No      Systolic Blood Pressure: 13176mHg      Is BP treated: No      HDL Cholesterol: 55 mg/dL      Total Cholesterol: 160 mg/dL       Reviewed 2015 USPSTF guidelines on Aspirin for primary CV prevention and discussed how they apply to this patient and lack of clear guidelines age 57+  Noted risk of bleeding and some disagreement between different guidelines on pros and cons of aspirin  for primary prevention.    We discussed NEJM articles from fall 2018 and how they have effected our understanding of pros and cons of aspirin for primary prevention.  Non on ASA    CHOL Well controlled continue present medications     Asthma doing well        Irritated 'but not sore'  throat no findings on exam today asked to call if ongoing symptoms may be related to allergies        Orders and Meds       Outpatient Encounter Medications as of 06/09/2019   Medication Sig Dispense Refill    atorvastatin (LIPITOR) 40 MG tablet Take 1 tablet (40 mg total) by mouth daily 90 tablet 3    mometasone (NASONEX) 50 MCG/ACT nasal spray 2 sprays by Nasal route daily 34 g 5    PULMICORT FLEXHALER 180 MCG/ACT flexhaler INHALE 1 PUFF INTO THE LUNGS TWICE DAILY 1 each 3    montelukast (SINGULAIR) 10 MG tablet Take 1 tablet (10 mg total) by mouth nightly as needed (asthma and allergies) 30 tablet 5    cetirizine (ZYRTEC) 10 MG tablet Take 10 mg by mouth daily  Calcium Carbonate-Vitamin D (CALTRATE 600+D) 600-400 MG-UNIT per tablet TAKE 1 TABLET TWICE DAILY (Patient taking differently: Take 1 tablet by mouth daily   ) 60 5    albuterol HFA 108 (90 Base) MCG/ACT inhaler Inhale 1-2 puffs into the lungs every 6 hours as needed for Wheezing   Shake well before each use. 3 Inhaler 2    [DISCONTINUED] cholecalciferol (VITAMIN D) 2000 units tablet Take 1 tablet (2,000 Units total) by mouth daily (Patient not taking: Reported on 06/09/2019)  100 capsule 3     No facility-administered encounter medications on file as of 06/09/2019.          Signed: Caroll Rancher, MD on 06/09/2019 at 1:59 PM

## 2019-10-20 ENCOUNTER — Encounter: Admit: 2019-10-20 | Payer: PRIVATE HEALTH INSURANCE | Attending: Adult Health | Primary: Internal Medicine

## 2019-10-20 DIAGNOSIS — Z01818 Encounter for other preprocedural examination: Secondary | ICD-10-CM

## 2019-10-24 ENCOUNTER — Telehealth: Payer: Self-pay | Admitting: Primary Care

## 2019-10-24 NOTE — Telephone Encounter (Signed)
Carl Olson calling for [employer] . Pt would like to speak with Dr Marsa Aris about the covid vaccine's and his allergies.     They are aware Dr Marsa Aris is working from home this afternoon.

## 2019-10-24 NOTE — Telephone Encounter (Signed)
Talked to wife reviewed his chart   Advised yes he can get vaccination  As can she based on review of her chart     They have not tried to schedule yet.   Advised she can look at [employer] but I will have staff member try to schedule her for [facility]

## 2019-10-27 ENCOUNTER — Encounter: Admit: 2019-10-27 | Payer: PRIVATE HEALTH INSURANCE | Attending: Surgery | Primary: Internal Medicine

## 2019-10-27 ENCOUNTER — Ambulatory Visit: Admit: 2019-10-27 | Payer: PRIVATE HEALTH INSURANCE | Attending: Family | Primary: Internal Medicine

## 2019-10-27 DIAGNOSIS — I10 Essential (primary) hypertension: Secondary | ICD-10-CM

## 2019-10-27 DIAGNOSIS — R21 Rash and other nonspecific skin eruption: Secondary | ICD-10-CM

## 2019-10-27 DIAGNOSIS — R011 Cardiac murmur, unspecified: Secondary | ICD-10-CM

## 2019-10-27 MED ORDER — HYDROCHLOROTHIAZIDE 25 MG TABLET
25 mg | Status: AC
Start: 2019-10-27 — End: ?

## 2019-10-27 MED ORDER — HYDRALAZINE 100 MG TABLET
100 mg | Status: AC
Start: 2019-10-27 — End: ?

## 2019-10-27 NOTE — Telephone Encounter
Patient is scheduled for a colonoscopy on 11/03/2019 at [facility]. Pre procedure call performed. Discharge transportation policy & NPO instructions given per pre procedure guidelines. Patient instructed to take hydrazaline with sip of water on the morning of scheduled procedure. Patient verbalizes understanding. Patient confirms receipt of pre procedure COVID test appointment from hospital.

## 2019-10-28 NOTE — Other
ReviewReview NP: Otilio Calhoun

## 2019-10-28 NOTE — Telephone Encounter (Signed)
Talked to Mrs Bernick strongly advised she do it.  She is willing to do it.  Can we help her set it up please

## 2019-10-31 ENCOUNTER — Ambulatory Visit: Payer: Commercial Managed Care - PPO | Attending: Pulmonary Disease

## 2019-10-31 DIAGNOSIS — Z23 Encounter for immunization: Secondary | ICD-10-CM | POA: Insufficient documentation

## 2019-11-02 ENCOUNTER — Inpatient Hospital Stay: Admit: 2019-11-02 | Discharge: 2019-11-02 | Payer: PRIVATE HEALTH INSURANCE | Primary: Internal Medicine

## 2019-11-02 DIAGNOSIS — Z01818 Encounter for other preprocedural examination: Secondary | ICD-10-CM

## 2019-11-02 DIAGNOSIS — Z01812 Encounter for preprocedural laboratory examination: Secondary | ICD-10-CM

## 2019-11-02 DIAGNOSIS — Z20822 Contact with and (suspected) exposure to covid-19: Secondary | ICD-10-CM

## 2019-11-02 LAB — COVID-19 CLEARANCE OR FOR PLACEMENT ONLY: BKR SARS-COV-2 RNA (COVID-19) (YH): NOT DETECTED

## 2019-11-03 ENCOUNTER — Ambulatory Visit: Admit: 2019-11-03 | Payer: PRIVATE HEALTH INSURANCE | Attending: Family | Primary: Internal Medicine

## 2019-11-03 ENCOUNTER — Inpatient Hospital Stay: Admit: 2019-11-03 | Discharge: 2019-11-03 | Payer: PRIVATE HEALTH INSURANCE | Primary: Internal Medicine

## 2019-11-03 ENCOUNTER — Encounter: Admit: 2019-11-03 | Payer: PRIVATE HEALTH INSURANCE | Attending: Surgery | Primary: Internal Medicine

## 2019-11-03 DIAGNOSIS — Z1211 Encounter for screening for malignant neoplasm of colon: Secondary | ICD-10-CM

## 2019-11-03 DIAGNOSIS — I10 Essential (primary) hypertension: Secondary | ICD-10-CM

## 2019-11-03 DIAGNOSIS — Z888 Allergy status to other drugs, medicaments and biological substances status: Secondary | ICD-10-CM

## 2019-11-03 DIAGNOSIS — D125 Benign neoplasm of sigmoid colon: Secondary | ICD-10-CM

## 2019-11-03 DIAGNOSIS — Z79899 Other long term (current) drug therapy: Secondary | ICD-10-CM

## 2019-11-03 DIAGNOSIS — G4733 Obstructive sleep apnea (adult) (pediatric): Secondary | ICD-10-CM

## 2019-11-03 DIAGNOSIS — K5730 Diverticulosis of large intestine without perforation or abscess without bleeding: Secondary | ICD-10-CM

## 2019-11-03 DIAGNOSIS — F17200 Nicotine dependence, unspecified, uncomplicated: Secondary | ICD-10-CM

## 2019-11-03 DIAGNOSIS — R21 Rash and other nonspecific skin eruption: Secondary | ICD-10-CM

## 2019-11-03 DIAGNOSIS — Z91018 Allergy to other foods: Secondary | ICD-10-CM

## 2019-11-03 DIAGNOSIS — D122 Benign neoplasm of ascending colon: Secondary | ICD-10-CM

## 2019-11-03 DIAGNOSIS — R011 Cardiac murmur, unspecified: Secondary | ICD-10-CM

## 2019-11-03 DIAGNOSIS — Z6833 Body mass index (BMI) 33.0-33.9, adult: Secondary | ICD-10-CM

## 2019-11-03 DIAGNOSIS — D120 Benign neoplasm of cecum: Secondary | ICD-10-CM

## 2019-11-03 DIAGNOSIS — E669 Obesity, unspecified: Secondary | ICD-10-CM

## 2019-11-03 MED ORDER — SODIUM PHOSPHATES 19 GRAM-7 GRAM/118 ML ENEMA
19-7 gram/118 mL | Status: CP
Start: 2019-11-03 — End: ?

## 2019-11-03 MED ORDER — LIDOCAINE (PF) 20 MG/ML (2 %) INJECTION SOLUTION
20 mg/mL (2 %) | Status: DC | PRN
Start: 2019-11-03 — End: 2019-11-03
  Administered 2019-11-03: 14:00:00 20 mg/mL (2 %) via INTRAVENOUS

## 2019-11-03 MED ORDER — SODIUM CHLORIDE 0.9 % (FLUSH) INJECTION SYRINGE
0.9 % | INTRAVENOUS | Status: DC | PRN
Start: 2019-11-03 — End: 2019-11-03

## 2019-11-03 MED ORDER — PROPOFOL 10 MG/ML INTRAVENOUS EMULSION
10 mg/mL | Status: DC | PRN
Start: 2019-11-03 — End: 2019-11-03
  Administered 2019-11-03 (×3): 10 mg/mL via INTRAVENOUS

## 2019-11-03 MED ORDER — SIMETHICONE 40 MG/0.6 ML ORAL DROPS,SUSPENSION
40 mg/0.6 mL | Status: DC | PRN
Start: 2019-11-03 — End: 2019-11-03

## 2019-11-03 MED ORDER — SODIUM PHOSPHATES 19 GRAM-7 GRAM/118 ML ENEMA
19-7 gram/118 mL | Status: DC | PRN
Start: 2019-11-03 — End: 2019-11-03
  Administered 2019-11-03: 14:00:00 19-7 gram/118 mL via RECTAL

## 2019-11-03 MED ORDER — SODIUM CHLORIDE 0.9 % (FLUSH) INJECTION SYRINGE
0.9 % | Freq: Three times a day (TID) | INTRAVENOUS | Status: DC
Start: 2019-11-03 — End: 2019-11-03
  Administered 2019-11-03: 13:00:00 0.9 mL via INTRAVENOUS

## 2019-11-03 MED ORDER — LACTATED RINGERS INTRAVENOUS SOLUTION
INTRAVENOUS | Status: DC
Start: 2019-11-03 — End: 2019-11-03
  Administered 2019-11-03: 13:00:00 1000.000 mL/h via INTRAVENOUS

## 2019-11-03 MED ORDER — PROPOFOL 10 MG/ML INTRAVENOUS EMULSION
10 mg/mL | Status: DC | PRN
Start: 2019-11-03 — End: 2019-11-03
  Administered 2019-11-03: 14:00:00 10 mL/h via INTRAVENOUS

## 2019-11-03 MED ORDER — SODIUM CHLORIDE 0.9 % (FLUSH) INJECTION SYRINGE
0.9 % | Freq: Three times a day (TID) | INTRAVENOUS | Status: DC
Start: 2019-11-03 — End: 2019-11-03

## 2019-11-03 NOTE — Anesthesia Post-Procedure Evaluation
Anesthesia Post-op NotePatient: Martin Calhoun):  Procedure(s) (LRB):COLONOSCOPY (N/A) Patient location: PACULast Vitals:  I have noted the vital signs as listed in the nursing notes.Mental status recovered: patient participates in evaluation: YesVital signs reviewed: YesRespiratory function stable:YesAirway is patent: YesCardiovascular function and hydration status stable: YesPain control satisfactory: YesNausea and vomiting control satisfactory:Yes

## 2019-11-03 NOTE — Other
Operative Diagnosis:Pre-op:   * No pre-op diagnosis entered * Patient Coded Diagnosis   Pre-op diagnosis: Colon cancer screening  Post-op diagnosis: Colon cancer screening  Patient Diagnosis   None    Post-op diagnosis:   * Colon cancer screening [Z12.11]Operative Procedure(s) :Procedure(s) (LRB):COLONOSCOPY (N/A)Post-op Procedure & Diagnosis ConfirmationPost-op Diagnosis: Post-op Diagnosis confirmed (no changes)Post-op Procedure: Post-op Procedure confirmed (no changes)Anesthesia ClarifiersGI/Endoscopy: Planned Screening Colonoscopy - Procedure Performed (see above)

## 2019-11-03 NOTE — Other
Post Anesthesia Transfer of Care NotePatient: Martin Calhoun) Performed: Procedure(s) (LRB):COLONOSCOPY (N/A) Patient location: PACU Last Vitals: Vitals Value Taken Time BP 163/109 11/03/19 1039 Temp 36.5 ?C 11/03/19 1039 Pulse 101 11/03/19 1040 Resp 18 11/03/19 1039 SpO2 100 % 11/03/19 1040 Vitals shown include unvalidated device data.Level of consciousness: awake, alert  and orientedTransport Vital Signs:  Stable since the last set of recorded intra-operative vital signsComplications: noneIntra-operative Intake & Output and Antibiotics as per Anesthesia record and discussed with the RN.

## 2019-11-03 NOTE — Discharge Instructions
Patient Education Colonoscopy, Adult, Care AfterThis sheet gives you information about how to care for yourself after your procedure. Your doctor may also give you more specific instructions. If you have problems or questions, call your doctor.What can I expect after the procedure?After the procedure, it is common to have:?	A small amount of blood in your poop for 24 hours.?	Some gas.?	Mild cramping or bloating in your belly.Follow these instructions at home:General instructions?	For the first 24 hours after the procedure:?	Do not drive or use machinery.?	Do not sign important documents.?	Do not drink alcohol.?	Do your daily activities more slowly than normal.?	Eat foods that are soft and easy to digest.?	Take over-the-counter or prescription medicines only as told by your doctor.To help cramping and bloating:?	Try walking around.?	Put heat on your belly (abdomen) as told by your doctor. Use a heat source that your doctor recommends, such as a moist heat pack or a heating pad.?	Put a towel between your skin and the heat source.?	Leave the heat on for 20-30 minutes.?	Remove the heat if your skin turns bright red. This is especially important if you cannot feel pain, heat, or cold. You can get burned.Eating and drinking?	Drink enough fluid to keep your pee (urine) clear or pale yellow.?	Return to your normal diet as told by your doctor. Avoid heavy or fried foods that are hard to digest.?	Avoid drinking alcohol for as long as told by your doctor.Contact a doctor if:?	You have blood in your poop (stool) 2-3 days after the procedure.Get help right away if:?	You have more than a small amount of blood in your poop.?	You see large clumps of tissue (blood clots) in your poop.?	Your belly is swollen.?	You feel sick to your stomach (nauseous).?	You throw up (vomit).?	You have a fever.?	You have belly pain that gets worse, and medicine does not help your pain.Summary?	After the procedure, it is common to have a small amount of blood in your poop. You may also have mild cramping and bloating in your belly.?	For the first 24 hours after the procedure, do not drive or use machinery, do not sign important documents, and do not drink alcohol.?	Get help right away if you have a lot of blood in your poop, feel sick to your stomach, have a fever, or have more belly pain.This information is not intended to replace advice given to you by your health care provider. Make sure you discuss any questions you have with your health care provider.Document Released: 09/02/2010 Document Revised: 05/31/2017 Document Reviewed: 09/11/2017Elsevier Patient Education ? 2020 Elsevier Inc.

## 2019-11-03 NOTE — Anesthesia Pre-Procedure Evaluation
This is a 59 y.o. male scheduled for COLONOSCOPY (N/A ).Review of Systems/ Medical HistoryPatient summary, EKG/Cardiac Studies , Labs, pre-procedure vitals, height, weight and NPO status reviewed.No previous anesthesia concernsAnesthesia Evaluation: Estimated body mass index is 33.9 kg/m? as calculated from the following:  Height as of this encounter: 6' 2 (1.88 m).  Weight as of this encounter: 119.7 kg. CC/HPI: 59 YO male with hx of HTN scheduled for screening colonoscopyWas told he had he had heart murmur yrs agoCurrent smoker - half pack a day for 10 yrsTook hydralazine at 4Am todayPast Surgical History:  NO PAST SURGERIESCardiovascular:Patient has a history of: hypertension.  No angina. -Exercise tolerance: >4 METS -Coronary Artery Disease:  No history of MI -Vascular Disease:  Negative   -Other Cardiovascular: . Chart reviewed by Dr. Kae Heller, The five year old TTE is essentially close to normal. The colonoscopy is a very low risk procedure, so preop ECG not required. I have no way to assess the current status of this patient. To be evaluated on Day of procedure. Echo 2016Normal left ventricular size, thickness, systolic function, and wall motion. LVEF calculated by biplane Simpson's is 60%.* Mild diastolic dysfunction, consistent with relaxation abnormality.* Normal right ventricular cavity size. There is normal right ventricular systolic function.* The left atrium is mildly dilated.  The LA volume index is 36 ml/m2.* There is mild aortic valve regurgitation.* IVC diameter < 2.1 cm that collapses > 50% with a sniff suggests normal RAP (0-5 mmHg, mean 3 mmHg).* No evidence of pericardial effusion.* Changes noted compared with prior study dated:  09/26/2011.  Left atrium is now enlarged.Marland Kitchen  Respiratory: The patient had a no recent URI -Obstructive Sleep apnea:   yes (Never got used weating the machine)-Airway disorders: -Asthma: noHEENT: Negative.Neuromuscular: -Intracranial disorders:  He did not have a cerebrovascular accidentGastrointestinal/Genitourinary: -Nutritional Disorders: Patient has has increased body weight- obesity.Endocrine/Metabolic: -Thyroid Disorders:  Patient has no hypothyroidism.Physical ExamCardiovascular:  Rhythm: regularPulmonary:  Patient's breath sounds clear to auscultationAirway:  Mallampati: IIITM distance: >3 FBNeck ROM: fullDental:  Dentition: missing and chippedOther Findings: MP2Anesthesia PlanASA 2 The primary anesthesia plan is  general. TIVA-MAC/GA with propofol, SASAMPerioperative Code Status confirmed: It is my understanding that the patient is currently designated as 'Full Code' and will remain so throughout the perioperative period.Anesthesia informed consent obtained. Consent obtained from: patientUse of blood products: consented    Consent Comment: Anesthetic plan discussed with the patient in detail. He understands the benefits & risks of anesthesia, and he consented to the planned anesthetic. Questions answered and all concerns addressedPlan discussed with CRNA.Anesthesiologist's Pre Op NoteI personally evaluated and examined the patient prior to the intra-operative phase of care.

## 2019-11-03 NOTE — Other
Pt underwent colonoscopy under anesthesia. Multiple polyps removed with forceps and cold snare. Specimens reviewed with Dr.Ikekpeauzu

## 2019-11-21 ENCOUNTER — Ambulatory Visit: Payer: Commercial Managed Care - PPO

## 2019-11-23 ENCOUNTER — Ambulatory Visit: Payer: Commercial Managed Care - PPO | Attending: Pulmonary Disease

## 2019-11-23 DIAGNOSIS — Z23 Encounter for immunization: Secondary | ICD-10-CM | POA: Insufficient documentation

## 2019-12-03 ENCOUNTER — Telehealth: Payer: Self-pay | Admitting: Primary Care

## 2019-12-03 ENCOUNTER — Other Ambulatory Visit: Payer: Self-pay | Admitting: Primary Care

## 2019-12-03 DIAGNOSIS — J3089 Other allergic rhinitis: Secondary | ICD-10-CM

## 2019-12-03 DIAGNOSIS — J4541 Moderate persistent asthma with (acute) exacerbation: Secondary | ICD-10-CM

## 2019-12-03 MED ORDER — PULMICORT FLEXHALER 180 MCG/ACT IN AEPB
1.0000 | INHALATION_SPRAY | Freq: Two times a day (BID) | RESPIRATORY_TRACT | 3 refills | Status: DC
Start: 2019-12-03 — End: 2021-05-08

## 2019-12-03 MED ORDER — ALBUTEROL SULFATE HFA 108 (90 BASE) MCG/ACT IN AERS *I*
1.0000 | INHALATION_SPRAY | RESPIRATORY_TRACT | 3 refills | Status: DC | PRN
Start: 2019-12-03 — End: 2019-12-11

## 2019-12-03 NOTE — Telephone Encounter (Signed)
Pt wife called back wants to know the status of her husbands refill. Wants a call when its sent over.

## 2019-12-03 NOTE — Telephone Encounter (Signed)
Not sure which inhaler so I put in a order for both as they both have not been filled in a while.

## 2019-12-03 NOTE — Telephone Encounter (Signed)
Pt informed

## 2019-12-03 NOTE — Telephone Encounter (Signed)
Pt wife called said she needs a refill for her husbands inhaler. Said she called it in to the pharmacy but never heard back from them.

## 2019-12-11 ENCOUNTER — Telehealth: Payer: Self-pay | Admitting: Primary Care

## 2019-12-11 MED ORDER — ALBUTEROL SULFATE HFA 108 (90 BASE) MCG/ACT IN AERS *I*
1.0000 | INHALATION_SPRAY | RESPIRATORY_TRACT | 3 refills | Status: DC | PRN
Start: 2019-12-11 — End: 2023-01-25

## 2019-12-11 NOTE — Telephone Encounter (Signed)
Writer called patients wife to clarify message . Patients wife states that patient needs Ventolin inhaler sent to [facility] because insurance changed . Pharmacy undated in chart;medication pended for provider review .

## 2019-12-11 NOTE — Telephone Encounter (Signed)
Pt wife called said her husband needs a new ventilator. She wants it sent to there new pharmacy [facility] [address].

## 2020-03-29 ENCOUNTER — Other Ambulatory Visit: Payer: Self-pay | Admitting: Primary Care

## 2020-03-29 DIAGNOSIS — J3089 Other allergic rhinitis: Secondary | ICD-10-CM

## 2020-03-29 NOTE — Telephone Encounter (Signed)
Additional message:    Last office visit at [place]:   06/09/2019  Patients upcoming appointments:  Future Appointments   Date Time Provider [place]   06/14/2020 10:40 AM Caroll Rancher, MD [facility] None     Recent Lab results:  GENERAL CHEMISTRY   Recent Labs     06/06/19  0824   NA 142   K 4.6   CL 103   CO2 30*   GAP 9   UN 10   CREAT 1.26*   GFRC 63   GFRB 73   GLU 94   CA 10.3*      LIPID PROFILE   Recent Labs     06/06/19  0824   CHOL 160   TRIG 67   HDL 55   LDLC 92      LIVER PROFILE   No value within the past 365 days   DIABETES THYROID   No value within the past 365 days No value within the past 365 days      Pending/Orders Labs:  Lab Frequency Next Occurrence   Measles IgG antibody Once 66/01/3015   Basic metabolic panel Once 08/22/3233   Lipid Panel (Reflex to Direct  LDL if Triglycerides more than 400) Once 06/08/2020   PSA (eff.11-2008) Once 06/08/2020   Vitamin D Once 06/08/2020

## 2020-04-14 ENCOUNTER — Other Ambulatory Visit: Payer: Self-pay | Admitting: Primary Care

## 2020-04-14 DIAGNOSIS — E7800 Pure hypercholesterolemia, unspecified: Secondary | ICD-10-CM

## 2020-04-14 NOTE — Telephone Encounter (Signed)
Additional message:    Last office visit at [place]:   06/09/2019  Patients upcoming appointments:  Future Appointments   Date Time Provider [place]   06/14/2020 10:40 AM Caroll Rancher, MD [facility] None     Recent Lab results:  GENERAL CHEMISTRY   Recent Labs     06/06/19  0824   NA 142   K 4.6   CL 103   CO2 30*   GAP 9   UN 10   CREAT 1.26*   GFRC 63   GFRB 73   GLU 94   CA 10.3*      LIPID PROFILE   Recent Labs     06/06/19  0824   CHOL 160   TRIG 67   HDL 55   LDLC 92      LIVER PROFILE   No value within the past 365 days   DIABETES THYROID   No value within the past 365 days No value within the past 365 days      Pending/Orders Labs:  Lab Frequency Next Occurrence   Measles IgG antibody Once 13/24/4010   Basic metabolic panel Once 27/25/3664   Lipid Panel (Reflex to Direct  LDL if Triglycerides more than 400) Once 06/08/2020   PSA (eff.11-2008) Once 06/08/2020   Vitamin D Once 06/08/2020

## 2020-05-19 ENCOUNTER — Encounter: Payer: Self-pay | Admitting: Primary Care

## 2020-06-05 ENCOUNTER — Other Ambulatory Visit
Admission: RE | Admit: 2020-06-05 | Discharge: 2020-06-05 | Disposition: A | Discharge status: Home / Self Care | Payer: Commercial Managed Care - PPO | Source: Ambulatory Visit | Attending: Primary Care | Admitting: Primary Care

## 2020-06-05 DIAGNOSIS — E7800 Pure hypercholesterolemia, unspecified: Secondary | ICD-10-CM

## 2020-06-05 DIAGNOSIS — E559 Vitamin D deficiency, unspecified: Secondary | ICD-10-CM

## 2020-06-05 DIAGNOSIS — Z125 Encounter for screening for malignant neoplasm of prostate: Secondary | ICD-10-CM | POA: Insufficient documentation

## 2020-06-05 DIAGNOSIS — Z0000 Encounter for general adult medical examination without abnormal findings: Secondary | ICD-10-CM

## 2020-06-05 DIAGNOSIS — I10 Essential (primary) hypertension: Secondary | ICD-10-CM

## 2020-06-05 LAB — BASIC METABOLIC PANEL
Anion Gap: 13 (ref 7–16)
CO2: 28 mmol/L (ref 20–28)
Calcium: 10 mg/dL (ref 8.6–10.2)
Chloride: 102 mmol/L (ref 96–108)
Creatinine: 1.29 mg/dL — ABNORMAL HIGH (ref 0.67–1.17)
GFR,Black: 70 *
GFR,Caucasian: 61 *
Glucose: 98 mg/dL (ref 60–99)
Lab: 13 mg/dL (ref 6–20)
Potassium: 4.9 mmol/L (ref 3.3–5.1)
Sodium: 143 mmol/L (ref 133–145)

## 2020-06-05 LAB — LIPID PANEL
Chol/HDL Ratio: 2.8
Cholesterol: 151 mg/dL
HDL: 53 mg/dL (ref 40–60)
LDL Calculated: 86 mg/dL
Non HDL Cholesterol: 98 mg/dL
Triglycerides: 60 mg/dL

## 2020-06-05 LAB — VITAMIN D: 25-OH Vit Total: 48 ng/mL (ref 30–60)

## 2020-06-05 LAB — PSA (EFF.4-2010): PSA (eff. 4-2010): 1.47 ng/mL (ref 0.00–4.00)

## 2020-06-06 LAB — MEASLES IGG AB: Measles IgG: NEGATIVE

## 2020-06-14 ENCOUNTER — Encounter: Payer: Self-pay | Admitting: Primary Care

## 2020-06-14 ENCOUNTER — Ambulatory Visit: Payer: Commercial Managed Care - PPO | Admitting: Primary Care

## 2020-06-14 VITALS — BP 132/90 | HR 104 | Resp 12 | Ht 60.2 in | Wt 189.0 lb

## 2020-06-14 DIAGNOSIS — E7800 Pure hypercholesterolemia, unspecified: Secondary | ICD-10-CM

## 2020-06-14 DIAGNOSIS — Z23 Encounter for immunization: Secondary | ICD-10-CM

## 2020-06-14 DIAGNOSIS — Z0000 Encounter for general adult medical examination without abnormal findings: Secondary | ICD-10-CM

## 2020-06-14 DIAGNOSIS — E559 Vitamin D deficiency, unspecified: Secondary | ICD-10-CM

## 2020-06-14 DIAGNOSIS — N182 Chronic kidney disease, stage 2 (mild): Secondary | ICD-10-CM

## 2020-06-14 DIAGNOSIS — R079 Chest pain, unspecified: Secondary | ICD-10-CM

## 2020-06-14 DIAGNOSIS — J4540 Moderate persistent asthma, uncomplicated: Secondary | ICD-10-CM

## 2020-06-14 MED ORDER — CHOLECALCIFEROL 1000 UNIT PO CAPS *WRAPPED*
1000.0000 [IU] | ORAL_CAPSULE | Freq: Every day | ORAL | 5 refills | Status: AC
Start: 2020-06-14 — End: ?

## 2020-06-14 NOTE — Progress Notes (Signed)
Patient ID: Carl Olson is a 59 y.o. year old male who presents today forAnnual Exam    BP 132/90 (BP Location: Left arm, Patient Position: Sitting, Cuff Size: adult)    Pulse 104    Resp 12    Ht 1.529 m (5' 0.2")    Wt 85.7 kg (189 lb)    SpO2 99%    BMI 36.67 kg/m   Estimated body mass index is 36.67 kg/m as calculated from the following:    Height as of this encounter: 1.529 m (5' 0.2").    Weight as of this encounter: 85.7 kg (189 lb).    SUBJECTIVE           59 year old with history of high cholesterol, asthma, reflux, allergic rhinitis    Patient presents for preventive health care visit.    He has had recent sternal discomfort when he is exerting himself.   He says discomfort lasts an hour or two. He has not noted arm or jaw pain or dizziness.  He does have some intermittent SOB with this.  He thinks this is related to a large work job he is doing he has to carry at least 80 lbs of tools to and from job sites.     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  Onset about a week ago after some unusual lifting.     Asthma he feels doing okay        Medications, allergies, past medical history, social history reviewed and updated in electronic record and full ROS reviewed with patient Review of Systems -   Review of Systems   Constitutional: Negative.    HENT: Negative.    Eyes: Negative.    Respiratory: Negative for cough and wheezing.    Cardiovascular: Positive for chest pain. Negative for palpitations, orthopnea, claudication, leg swelling and PND.   Gastrointestinal: Negative.    Genitourinary: Negative.    Musculoskeletal: Negative for falls, joint pain, myalgias and neck pain.   Skin: Negative.    Neurological: Negative for dizziness, tremors, loss of consciousness and headaches.   Endo/Heme/Allergies: Negative.  Negative for environmental allergies. Does not bruise/bleed easily.   Psychiatric/Behavioral: Negative.  Negative for depression, memory loss and suicidal ideas.         Allergy / Social History / Medications:     Allergies   Allergen Reactions    Moxifloxacin Other (See Comments)     Penile glans swelling     Family History   Problem Relation Age of Onset    High Blood Pressure Mother     Diabetes Mother     Dementia Mother     No Known Problems Father     High Blood Pressure Sister     High Blood Pressure Sister     Diabetes Sister     High Blood Pressure Sister     High Blood Pressure Sister     High Blood Pressure Sister     Colon cancer Neg Hx     Colon polyps Neg Hx      Past Medical History:   Diagnosis Date    Asthma     Colonic polyp tubular adenoma next scope due 2022 November  12/27/2016    2019 colonoscopy  FINAL DIAGNOSIS:  (A) Cecum, polyp, biopsy:    - Tubular adenoma.  (B) Colon, right, polyps, biopsy:    - Tubular adenomas.  (C) Colon, transverse, polyps, biopsy,    -  Tubular adenomas.    35yr[age]  Surgical Pathology  FINAL DIAGNOSIS: Colon, cecum, biopsy: - Tubular adenoma.       Esophageal reflux 01/29/2006    Created by Conversion     High cholesterol 04/04/2007    Lower GI bleed 06/2018    After colonoscopy with polypectomy     Vitamin D deficiency 05/05/2008         Social History     Social History Narrative    Married, two step daughters.  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  [employer]atorfor [employer]  Cowboys football fan, enjoys casino's.    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  No acute distress.  Appears stated age.  Skin: No rash.  No atypical nevi.    Lymph nodes: No cervical, submandibular, supraclavicular,  axillary, inguinal lymphadenopathy.  HEENT: Pupils symmetric.  Conjunctiva pink.  Sclera anicteric.  Turbinates without edema or erythema.  Oropharynx moist with no lesions.  TMs are shiny .  Neck: No thyromegaly or thyroid nodules. No other masses, no crepitus. No carotid bruits.  No jugular venous distention.  Chest/Breast:   Recumbent exams.  No mass or tenderness.  Skin without dimpling or retraction.  No nipple discharge.  Lungs: Clear to auscultation bilaterally, normal respiratory effort, no wheeze or rales   Cardiac:  Regular S1 and S2 with no murmur, rub, gallop.  PMI nondisplaced.  Abdomen: Soft, non tender, non distended.  No palpable HSM or mass.  Normal active bowel sounds.  Genitals: deferred   Rectal/prostate: deferred   Spine/musculoskeletal: Spine is non tender.  No joint deformities .  Good functional range of motion  Extremities: No clubbing, cyanosis, edema.  Vascular: 2+ radial, brachial, femoral, dorsalis pedis pulses bilaterally.  Neurologic: Patient is alert and oriented.  Sensation intact to light touch  Gait is without ataxia.      Psych:  Appropriate historian , normal affect and interaction, good eye contact.       Recent Labs and Other Results     Lab Results   Component Value Date    NA 143 06/05/2020    K 4.9 06/05/2020    CL 102 06/05/2020    CO2 28 06/05/2020    UN 13 06/05/2020    CREAT 1.29 (H) 06/05/2020    VID25 48 06/05/2020    WBC 8.1 08/26/2018    HGB 14.9 08/26/2018    HCT 47 08/26/2018    PLT 256 08/26/2018    TSH 1.44 08/26/2018    CHOL 151 06/05/2020    TRIG 60 06/05/2020    HDL 53 06/05/2020    LDLC 86 06/05/2020    CHHDC 2.8 06/05/2020     Hemoglobin A1C   Date Value Ref Range Status   12/28/2016 6.0 4.0 - 6.0 % Final  Comment:                                   NON-DIABETIC       Echo 2019  Interpretation Summary     Normal LV size, mass and function with no regional wall motion abnormalities. The calculated LVEF is 10%   Normal diastolic function.   Normal RV size  and function.   Aortic and mitral valve sclerosis without significant stenosis.    Estimated normal pulmonary artery systolic pressure.   No prior study is available for comparison.     EKG sinus axis positive rate 80 prolonged PR 204 milli seconds     ASSESSMENT AND PLAN     1. Annual physical exam     2. Health care maintenance  Basic metabolic panel    Lipid Panel (Reflex to Direct  LDL if Triglycerides more than 400)    PSA (eff.11-2008)   3. High cholesterol     4. Moderate persistent asthma without complication     5. Vitamin D deficiency  cholecalciferol (VITAMIN D) 1,000 unit capsule   6. Chest pain, unspecified type  Exercise Stress Echo Complete   7. CKD (chronic kidney disease) stage 2, GFR 60-89 ml/min     8. Need for vaccination  Flu Quadrivalent <65 YO (normal dose)    MMR (M-M-R II)       Health Maintenance  Preventive health guidelines reviewed with patient and highlighted particular issues  pertinent to their health, gender, and age.    Discussed preventive lab testing and immunizations. See discussion below and orders for particular items discussed at today's visit.      Items discussed include (but are not limited only to) those indicated below     Health care proxy / advance directives reviewed on file document   Lifestyle modification including diet and exercise -    He has lost weight from last year encouraged further weight loss.     Wt Readings from Last 4 Encounters:   06/14/20 85.7 kg (189 lb)   06/09/19 89.8 kg (198 lb)   08/01/18 88 kg (194 lb)   07/19/18 87.1 kg (192 lb)        Skin CA awareness/prevention discussed  --Sunscreen use / long sleeved clothing  --Monitor moles for the following changes:    A: asymmetry   B: border changes   C: color changes    D: diameter greater than a pencil eraser size    E: evolving, or changing mole       Seat belt use counseled     Gun safe storage review offered     Dentistry yearly advised    [place] every 1-2 years advised        Reviewed blood  work on file / discussed additional blood tests     Hepatitis C screening for adults 18-79     EKG for those with HTN/significant CVD risk factors or age > 40     Colon CA screening starting at 68      next year he is due         Prostate cancer screening           Vaccinations:  Immunization History   Administered Date(s) Administered    Covid-19 mRNA vaccine (PFIZER) IM 30 mcg/0.61m 10/31/2019, 11/23/2019    Influenza Adult(397yrnd up) 06/05/2011    Influenza Quadrivalent 0.57m67m  prefilled syringe/single dose vial(FluLaval,Fluzone,Afluria,Fluarix) 06/28/2012, 07/20/2015, 07/13/2016, 06/29/2017, 06/30/2018, 05/15/2019, 06/14/2020    Influenza Whole 07/08/2007, 05/04/2008, 05/06/2009    Influenza multi-dose vial 06/12/2013    MMR 06/14/2020    Pneumococcal Polysaccharide (Pneumovax) 06/29/2017    Td 07/05/2018    Tdap 03/02/2008     COVID Vaccination *he had primary series in the spring he felt off for a half day the next day and missed work   Yearly flu shot advised        Shingrix vaccination discussed and advised for patients 34 and older  He defers for now        MMR or measles IGG for those born after 1956        CV Risk stratification  Reviewed 2018 AHA cholesterol management guidelines with patient and discussed on my review how I think they apply to this patients care.      Lab Results   Component Value Date    CHOL 151 06/05/2020    HDL 53 06/05/2020      Risk calculation tool done and discussed with patient.    The 10-year ASCVD risk score Mikey Bussing DC Brooke Bonito., et al., 2013) is: 7.2%    Values used to calculate the score:      Age: 34 years      Sex: Male      Is Non-Hispanic African American: Yes      Diabetic: No      Tobacco smoker: No      Systolic Blood Pressure: 160 mmHg      Is BP treated: No      HDL Cholesterol: 53 mg/dL      Total Cholesterol: 151 mg/dL  We reviewed potential  Risk Enhancers            Chronic kidney disease         COVID safety discussed  -          Chest discomfort with  exertion - I thinks this concerning history and needs further evaluation   Advised out of work until stress testing done, but he is not willing to stop advised of risk of heart attack.   We discussed short interval follow up and he is reluctant agree to reevaluate six month follow up post stress testing      Vitamin D deficiency Well controlled continue present medications        Orders and Meds       Outpatient Encounter Medications as of 06/14/2020   Medication Sig Dispense Refill    atorvastatin (LIPITOR) 40 MG tablet TAKE 1 TABLET(40 MG) BY MOUTH DAILY 90 tablet 3    mometasone (NASONEX) 50 MCG/ACT nasal spray SHAKE LIQUID AND USE 2 SPRAYS IN EACH NOSTRIL DAILY 34 g 5    cetirizine (ZYRTEC) 10 MG tablet Take 10 mg by mouth daily      [DISCONTINUED] Calcium Carbonate-Vitamin D (CALTRATE 600+D) 600-400 MG-UNIT per tablet TAKE 1 TABLET TWICE DAILY (Patient taking differently: Take 1 tablet by mouth daily   ) 60 5    cholecalciferol (VITAMIN D) 1,000 unit capsule Take 1 capsule (1,000 units total) by mouth daily 100 capsule 5    albuterol HFA (PROVENTIL, VENTOLIN, PROAIR HFA) 108 (90 Base) MCG/ACT inhaler Inhale 1-2 puffs into the lungs every 4 hours as needed for Wheezing Shake well before each use. 1 each 3    budesonide (PULMICORT FLEXHALER) 180 MCG/ACT flexhaler Inhale 1 puff into the lungs 2 times daily 1 each 3  montelukast (SINGULAIR) 10 MG tablet Take 1 tablet (10 mg total) by mouth nightly as needed (asthma and allergies) (Patient not taking: Reported on 06/14/2020) 30 tablet 5     No facility-administered encounter medications on file as of 06/14/2020.         Signed: Caroll Rancher, MD on 06/14/2020 at 4:42 PM

## 2020-06-14 NOTE — Patient Instructions (Addendum)
Look into medicaid for your wife - she may not qualify based on your income but it's worth looking into if you guys qualify.     Stop the calcium pills and start vitamin d 1000 IU (25 mcg) on it's own     I encourage you to get a COVID booster.

## 2020-07-07 ENCOUNTER — Telehealth: Payer: Self-pay | Admitting: Primary Care

## 2020-07-07 ENCOUNTER — Ambulatory Visit
Admission: RE | Admit: 2020-07-07 | Discharge: 2020-07-07 | Disposition: A | Discharge status: Home / Self Care | Payer: Commercial Managed Care - PPO | Source: Ambulatory Visit | Attending: Internal Medicine | Admitting: Internal Medicine

## 2020-07-07 DIAGNOSIS — R079 Chest pain, unspecified: Secondary | ICD-10-CM | POA: Insufficient documentation

## 2020-07-07 LAB — EXERCISE STRESS ECHO COMPLETE
Aortic Diameter (mid tubular): 3.1 cm
Aortic Diameter (sinus of Valsalva): 3.7 cm
BMI: 37 kg/m2
BP Diastolic: 80 mmHg
BP Systolic: 124 mmHg
BSA: 1.9 m2
Deceleration Time - MV: 195 ms
E/A ratio: 0.9
ECG PR interval: 200 ms
ECG QRS interval: 80 ms
Echo RV Stroke Work Index Estimate: 464.1 mmHg•mL/m2
Estimated workload: 9 METS
Heart Rate: 88 {beats}/min
Height: 60 in
LA Diameter BSA Index: 1.8 cm/m2
LA Diameter Height Index: 2.2 cm/m
LA Diameter: 3.4 cm
LA Systolic Vol BSA Index: 19.5 mL/m2
LA Systolic Vol Height Index: 24.3 mL/m
LA Systolic Volume: 37 mL
LV ASE Mass BSA Index: 81.1 gm/m2
LV ASE Mass Height 2.7 Index: 49.4 gm/m2.7
LV ASE Mass Height Index: 101.2 gm/m
LV ASE Mass: 154.2 gm
LV CO BSA Index: 3.07 L/min/m2
LV Cardiac Output: 5.83 L/min
LV Diastolic Volume Index: 62.3 mL/m2
LV Isovolumic Relaxation Time: 110.4 ms
LV Posterior Wall Thickness: 1.06 cm
LV SV - LVOT SV Diff: -2.2 mL
LV SV - LVOT SV Diff: -3.32 %
LV SV BSA Index: 34.9 mL/m2
LV SV Height Index: 43.5 mL/m
LV Septal Thickness: 1.21 cm
LV Stroke Volume: 66.3 mL
LV Systolic Volume Index: 27.4 mL/m2
LV wall/cavity ratio: 0.56
LVED Diameter BSA Index: 2.12 cm/m2
LVED Diameter Height Index: 2.64 cm/m
LVED Diameter: 4.03 cm
LVED Volume BSA Index: 62 ml/m2
LVED Volume BSA Index: 62.3 mL/m2
LVED Volume Height Index: 77.6 mL/m
LVED Volume: 118.3 mL
LVEF (Volume): 56 %
LVES Volume BSA Index: 27 ml/m2
LVES Volume BSA Index: 27.4 mL/m2
LVES Volume Height Index: 34.1 mL/m
LVES Volume: 52 mL
LVOT Area (calculated): 3.49 cm2
LVOT Cardiac Index: 3.17 L/min/m2
LVOT Cardiac Output: 6.03 L/min
LVOT Diameter: 2.11 cm
LVOT PWD VTI: 19.6 cm
LVOT PWD Velocity (mean): 75.7 cm/s
LVOT PWD Velocity (peak): 102.1 cm/s
LVOT SV BSA Index: 36.05 mL/m2
LVOT SV Height Index: 44.9 mL/m
LVOT Stroke Rate (mean): 264.6 mL/s
LVOT Stroke Rate (peak): 356.8 mL/s
LVOT Stroke Volume: 68.5 cc
MPHR: 162 {beats}/min
MR Regurgitant Fraction (LV SV Mtd): -0.03
MR Regurgitant Volume (LV SV Mtd): -2.2 mL
MV Peak A Velocity: 81.8 cm/s
MV Peak E Velocity: 73.3 cm/s
Mitral Annular E/Ea Vel Ratio: 11.09
Mitral Annular Ea Velocity: 6.61 cm/s
Peak DBP: 80 mmHg
Peak Gradient - TR: 13.3 mmHg
Peak HR: 169 {beats}/min
Peak SBP: 170 mmHg
Peak Velocity - TR: 180.82 cm/s
Percent MPHR: 104.3 %
Pulmonary Vascular Resistance Estimate: 2.3 mmHg
RA Pressure Estimate: 7 mmHg
RA Volume BSA Index: 5.8 mL/m2
RA Volume Height Index: 7.2 mL/m
RA Volume: 11 mL
RPP: 28730 BPM x mmHG
RR Interval: 681.82 ms
RV Peak Systolic Pressure: 20.3 mmHg
Stress LV SV BSA Index: 27.5 mL/m2
Stress LV SV Height Index: 34.3 mL/m
Stress LV Stroke Volume Change: -14 mL
Stress LV Stroke Volume: 52.3 mL
Stress LVED Volume BSA Index: 44 ml/m2
Stress LVED Volume BSA Index: 44.4 mL/m2
Stress LVED Volume Change: -33.9 mL
Stress LVED Volume Height Index: 55.4 mL/m
Stress LVED Volume: 84.4 mL
Stress LVEF (Volume) Change: 5.9 %
Stress LVEF (Volume): 62 %
Stress LVES Volume BSA Index: 16.9 mL/m2
Stress LVES Volume BSA Index: 17 ml/m2
Stress LVES Volume Change: -19.9 mL
Stress LVES Volume Height Index: 21.1 mL/m
Stress LVES Volume: 32.1 mL
Stress Peak Stage: 2.57
Stress duration (min): 7 min
Stress duration (sec): 43 s
Weight (lbs): 189 [lb_av]
Weight: 3024 oz

## 2020-07-07 NOTE — Telephone Encounter (Signed)
Spoke with pt. Made aware of results per Dr. Marsa Aris. Stated that he has a bit of pain but it is better than it was. He will call if pain increases.

## 2020-07-07 NOTE — Telephone Encounter (Signed)
-----   Message from Caroll Rancher, MD sent at 07/07/2020 12:33 PM EST -----  Stress test normal please let him know check to see if he is still having exertional chest pain (he thought musculoskeletal exertional pain this was to confirm it wasn't cardiac)

## 2020-07-07 NOTE — Discharge Instructions (Signed)
Patient is here for a stress echo. Instructed patient to resume medications and to follow up with referring provider. Patient verbalized understanding.

## 2020-12-13 ENCOUNTER — Telehealth: Payer: Self-pay | Admitting: Primary Care

## 2020-12-13 NOTE — Telephone Encounter (Signed)
Talked to patient discussed option of anti virals as he is feeling better he declines He is not vaccinated but seems to be doing better quickly.      We discussed home quarantine guidelines wife is high risk

## 2020-12-13 NOTE — Telephone Encounter (Signed)
Patient calling this morning stating "I think I might have the flu or Covid". Asked if he had any home tests, he stated that he took one Saturday and it had 2 lines.     Sx's:    body aches  cong  runny nose  cough  fever - 98.9 -- 101 before tylenol  fatigue  chills  sore throat    sx's start - Saturday  Covid test - Saturday - positive

## 2020-12-13 NOTE — Telephone Encounter (Signed)
Patient states nasal congestion, chest congestion, fever prior to tylenol, sore throat, fatigue    States he feels like he is getting better    Relief with tylenol     Denies chest pain, difficulty breathing, nausea, vomiting, diarrhea     Patient denies antiviral at this time.    Writer advised patient to try mucinex, increase fluids, warm liquids, throat lozenges

## 2020-12-20 ENCOUNTER — Ambulatory Visit: Payer: Commercial Managed Care - PPO | Admitting: Primary Care

## 2021-01-14 ENCOUNTER — Encounter: Payer: Self-pay | Admitting: Primary Care

## 2021-01-14 ENCOUNTER — Ambulatory Visit: Payer: Commercial Managed Care - PPO | Admitting: Primary Care

## 2021-01-14 VITALS — BP 135/89 | HR 99 | Ht 60.0 in | Wt 190.0 lb

## 2021-01-14 DIAGNOSIS — E559 Vitamin D deficiency, unspecified: Secondary | ICD-10-CM

## 2021-01-14 DIAGNOSIS — J3089 Other allergic rhinitis: Secondary | ICD-10-CM

## 2021-01-14 DIAGNOSIS — J4540 Moderate persistent asthma, uncomplicated: Secondary | ICD-10-CM

## 2021-01-14 DIAGNOSIS — E7800 Pure hypercholesterolemia, unspecified: Secondary | ICD-10-CM

## 2021-01-14 NOTE — Progress Notes (Signed)
Patient ID: Carl Olson is a 59 y.o. year old male who presents today forHyperlipidemia    BP 135/89 (BP Location: Left arm, Patient Position: Sitting, Cuff Size: large adult)    Pulse 99    Ht 1.524 m (5')    Wt 86.2 kg (190 lb)    BMI 37.11 kg/m   Estimated body mass index is 37.11 kg/m as calculated from the following:    Height as of this encounter: 1.524 m (5').    Weight as of this encounter: 86.2 kg (190 lb).    SUBJECTIVE             60 year old with history of high cholesterol, asthma, reflux, allergic rhinitis    Chest discomfot reported at last visit had stress test resolved.      CHOL  Reports medication compliance.     Asthma unsuually well controlled for him no episodes.  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      Allergic rhinitis Well controlled he feels not using singulair at this time      Social history he is taking over from his boss later this month temporarily      Medications and problem list reviewed   during visit     Allergy / Social History / Medications:     Allergies   Allergen Reactions    Moxifloxacin Other (See Comments)     Penile glans swelling     Family History   Problem Relation Age of Onset    High Blood Pressure Mother     Diabetes Mother     Dementia Mother     No Known Problems Father     High Blood Pressure Sister     High Blood Pressure Sister     Diabetes Sister     High Blood Pressure Sister     High Blood Pressure Sister     High Blood Pressure Sister     Colon cancer Neg Hx     Colon polyps Neg Hx      Past Medical History:   Diagnosis Date    Asthma     Colonic polyp tubular adenoma next scope due 2022 November  12/27/2016    2019 colonoscopy  FINAL DIAGNOSIS:  (A) Cecum, polyp, biopsy:    - Tubular adenoma.  (B) Colon, right, polyps, biopsy:    - Tubular adenomas.  (C) Colon, transverse, polyps, biopsy,    - Tubular adenomas.    39yr ago  Surgical Pathology  FINAL  DIAGNOSIS: Colon, cecum, biopsy: - Tubular adenoma.       Esophageal reflux 01/29/2006    Created by Conversion     High cholesterol 04/04/2007    Lower GI bleed 06/2018    After colonoscopy with polypectomy     Vitamin D deficiency 05/05/2008         Social History     Social History Narrative    Married, two step daughters.  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  Cowboys football fan, enjoys casino's.    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         Social History     Tobacco Use    Smoking status: Never Smoker    Smokeless tobacco: Never Used   Substance Use Topics    Alcohol use:  Yes     Comment: 1 beer every few months              OBJECTIVE     Vitals:    01/14/21 0826   BP: 135/89   Pulse: 99   Weight: 86.2 kg (190 lb)   Height: 1.524 m (5')       General Appearance: Awake, alert, No distress        Psych:  Good historian, appropriate eye contact, normal affect      Recent Labs and Other Results     Lab Results   Component Value Date    NA 143 06/05/2020    K 4.9 06/05/2020    CL 102 06/05/2020    CO2 28 06/05/2020    UN 13 06/05/2020    CREAT 1.29 (H) 06/05/2020    VID25 48 06/05/2020    WBC 8.1 08/26/2018    HGB 14.9 08/26/2018    HCT 47 08/26/2018    PLT 256 08/26/2018    TSH 1.44 08/26/2018    CHOL 151 06/05/2020    TRIG 60 06/05/2020    HDL 53 06/05/2020    LDLC 86 06/05/2020    [place] 2.8 06/05/2020     Hemoglobin A1C   Date Value Ref Range Status   12/28/2016 6.0 4.0 - 6.0 % Final     Comment:                                   NON-DIABETIC       Stress echo 07/07/20                Interpretation Summary    Normal resting LVEF without significant wall motion abnormalities. Mild mitral regurgitation. Estimated normal pulmonary artery systolic pressure with normal right heart size and function. Negative exercise stress echo for myocardial ischemia at the diagnostic workload achieved.               ASSESSMENT AND PLAN     1. Moderate  persistent asthma without complication     2. High cholesterol     3. Vitamin D deficiency     4. Seasonal allergic rhinitis due to other allergic trigger           Asthma Well controlled continue present medications     Allergic rhinitis Well controlled continue present medications     CHOL Well controlled on last check continue present medications     Vitamin D Well controlled on last check    HM advised shingles and COVID vaccinations he is going to do the former possibly in the fall when job requiremnts are less            Orders and Meds       Outpatient Encounter Medications as of 01/14/2021   Medication Sig Dispense Refill    cholecalciferol (VITAMIN D) 1,000 unit capsule Take 1 capsule (1,000 units total) by mouth daily 100 capsule 5    atorvastatin (LIPITOR) 40 MG tablet TAKE 1 TABLET(40 MG) BY MOUTH DAILY 90 tablet 3    mometasone (NASONEX) 50 MCG/ACT nasal spray SHAKE LIQUID AND USE 2 SPRAYS IN EACH NOSTRIL DAILY 34 g 5    budesonide (PULMICORT FLEXHALER) 180 MCG/ACT flexhaler Inhale 1 puff into the lungs 2 times daily 1 each 3    cetirizine (ZYRTEC) 10 MG tablet Take 10 mg by mouth daily  albuterol HFA (PROVENTIL, VENTOLIN, PROAIR HFA) 108 (90 Base) MCG/ACT inhaler Inhale 1-2 puffs into the lungs every 4 hours as needed for Wheezing Shake well before each use. 1 each 3    montelukast (SINGULAIR) 10 MG tablet Take 1 tablet (10 mg total) by mouth nightly as needed (asthma and allergies) (Patient not taking: Reported on 06/14/2020) 30 tablet 5     No facility-administered encounter medications on file as of 01/14/2021.         Signed: Caroll Rancher, MD on 01/14/2021 at 8:43 AM

## 2021-01-14 NOTE — Patient Instructions (Addendum)
Do your blood work in November it will expire 12/2       I encourage you to get your COVID booster      We discussed the option of having you get the    shingles vaccine Shingrix.     This is a two shot series.    This vaccine  replaces the old shingles vaccine.   As we discussed in the world of COVID there are some important pros and cons to consider on when you would want to get this vaccine as the post vaccine side effects may be mistaken for COVID.      As we discussed this vaccine does have some significant short lived side effects for some people who receive this.  Some people who received this vaccine felt tired, had muscle pain, a headache, shivering, fever, stomach pain, or nausea. Symptoms went away on their own in about 2 to 3 days.   About 1 out of 6 people who got Shingrix experienced side effects that prevented them from doing regular activities.  Side effects are more common in younger people.       If you decide you want to get it you will need to check with your insurance company about coverage for Shingrix when you call them confirm WHERE they cover it not just if they cover it.  Some insurances pay for it here sometimes some only pay for you to get it from the pharmacy.  Others do not cover it.    I'm happy to order it however it is cheapest for you to get.    If you have no or only partial coverage you may want to look at the prices at the [facility] department of health they may be cheaper for you.

## 2021-04-29 ENCOUNTER — Other Ambulatory Visit: Payer: Self-pay | Admitting: Primary Care

## 2021-04-29 DIAGNOSIS — J3089 Other allergic rhinitis: Secondary | ICD-10-CM

## 2021-04-29 DIAGNOSIS — E7800 Pure hypercholesterolemia, unspecified: Secondary | ICD-10-CM

## 2021-05-01 ENCOUNTER — Other Ambulatory Visit: Payer: Self-pay | Admitting: Primary Care

## 2021-05-01 DIAGNOSIS — J3089 Other allergic rhinitis: Secondary | ICD-10-CM

## 2021-05-02 NOTE — Telephone Encounter (Signed)
Additional message:    Last office visit at [facility]:   01/14/2021  Patients upcoming appointments:  Future Appointments   Date Time Provider [place]   07/21/2021  9:00 AM Caroll Rancher, MD [facility] None     Recent Lab results:  GENERAL CHEMISTRY   Recent Labs     06/05/20  0852   NA 143   K 4.9   CL 102   CO2 28   GAP 13   UN 13   CREAT 1.29*   GFRC 61   GFRB 70   GLU 98   CA 10.0      LIPID PROFILE   Recent Labs     06/05/20  0852   CHOL 151   TRIG 60   HDL 53   LDLC 86      LIVER PROFILE   No value within the past 365 days   DIABETES THYROID   No value within the past 365 days No value within the past 365 days      Pending/Orders Labs:  Lab Frequency Next Occurrence   Basic metabolic panel Once 99991111   Lipid Panel (Reflex to Direct  LDL if Triglycerides more than 400) Once 06/14/2021   PSA (eff.11-2008) Once 06/14/2021

## 2021-05-05 ENCOUNTER — Ambulatory Visit: Payer: Commercial Managed Care - PPO

## 2021-05-05 DIAGNOSIS — Z23 Encounter for immunization: Secondary | ICD-10-CM

## 2021-05-05 NOTE — Progress Notes (Signed)
Patient in clinic for flu vaccine today. Patient tolerated well.

## 2021-05-06 ENCOUNTER — Other Ambulatory Visit: Payer: Self-pay | Admitting: Primary Care

## 2021-05-12 ENCOUNTER — Encounter: Admit: 2021-05-12 | Payer: PRIVATE HEALTH INSURANCE | Attending: Adult Health | Primary: Internal Medicine

## 2021-05-12 DIAGNOSIS — Z01818 Encounter for other preprocedural examination: Secondary | ICD-10-CM

## 2021-05-16 ENCOUNTER — Encounter: Admit: 2021-05-16 | Payer: PRIVATE HEALTH INSURANCE | Primary: Internal Medicine

## 2021-05-24 ENCOUNTER — Encounter: Admit: 2021-05-24 | Payer: PRIVATE HEALTH INSURANCE | Attending: Adult Health | Primary: Internal Medicine

## 2021-05-24 DIAGNOSIS — Z01818 Encounter for other preprocedural examination: Secondary | ICD-10-CM

## 2021-05-30 ENCOUNTER — Encounter: Admit: 2021-05-30 | Payer: PRIVATE HEALTH INSURANCE | Primary: Internal Medicine

## 2021-05-31 ENCOUNTER — Encounter: Admit: 2021-05-31 | Payer: PRIVATE HEALTH INSURANCE | Attending: Surgery | Primary: Internal Medicine

## 2021-05-31 DIAGNOSIS — R011 Cardiac murmur, unspecified: Secondary | ICD-10-CM

## 2021-05-31 DIAGNOSIS — G4733 Obstructive sleep apnea (adult) (pediatric): Secondary | ICD-10-CM

## 2021-05-31 DIAGNOSIS — K219 Gastro-esophageal reflux disease without esophagitis: Secondary | ICD-10-CM

## 2021-05-31 DIAGNOSIS — R21 Rash and other nonspecific skin eruption: Secondary | ICD-10-CM

## 2021-05-31 DIAGNOSIS — I10 Essential (primary) hypertension: Secondary | ICD-10-CM

## 2021-05-31 MED ORDER — ZINC ORAL
Freq: Every day | ORAL | Status: AC
Start: 2021-05-31 — End: ?

## 2021-05-31 MED ORDER — VITAMIN D3 ORAL
Freq: Every day | ORAL | Status: AC
Start: 2021-05-31 — End: ?

## 2021-05-31 MED ORDER — OMEGA-3 FATTY ACIDS 1,000 MG CAPSULE
1000 mg | Freq: Every day | ORAL | Status: AC
Start: 2021-05-31 — End: ?

## 2021-05-31 MED ORDER — ASCORBIC ACID (VITAMIN C) 500 MG TABLET
500 mg | Freq: Every day | ORAL | Status: AC
Start: 2021-05-31 — End: ?

## 2021-05-31 MED ORDER — COENZYME Q10 10 MG CAPSULE
10 mg | Freq: Every day | ORAL | Status: AC
Start: 2021-05-31 — End: ?

## 2021-05-31 NOTE — Telephone Encounter
Patient is scheduled for a colonoscopy on 06/06/2021 at [facility] endoscopy. Pre procedure call performed. Discharge transportation policy & NPO instructions given per pre procedure guidelines. Patient instructed to follow the bowel prep instructions per Dr. Ladene Artist office. Patient instructed to take Hydralazine with a sip of water on the morning of scheduled procedure. Patient verbalized his understanding.Patient is scheduled for COVID testing on 06/05/2021.

## 2021-06-03 ENCOUNTER — Ambulatory Visit: Admit: 2021-06-03 | Payer: PRIVATE HEALTH INSURANCE | Attending: Anesthesiology | Primary: Internal Medicine

## 2021-06-03 DIAGNOSIS — Z1211 Encounter for screening for malignant neoplasm of colon: Secondary | ICD-10-CM

## 2021-06-05 ENCOUNTER — Inpatient Hospital Stay: Admit: 2021-06-05 | Discharge: 2021-06-05 | Payer: PRIVATE HEALTH INSURANCE | Primary: Internal Medicine

## 2021-06-05 DIAGNOSIS — Z20822 Contact with and (suspected) exposure to covid-19: Secondary | ICD-10-CM

## 2021-06-05 DIAGNOSIS — Z01812 Encounter for preprocedural laboratory examination: Secondary | ICD-10-CM

## 2021-06-05 DIAGNOSIS — Z01818 Encounter for other preprocedural examination: Secondary | ICD-10-CM

## 2021-06-06 ENCOUNTER — Encounter: Admit: 2021-06-06 | Payer: PRIVATE HEALTH INSURANCE | Attending: Surgery | Primary: Internal Medicine

## 2021-06-06 ENCOUNTER — Ambulatory Visit: Admit: 2021-06-06 | Payer: PRIVATE HEALTH INSURANCE | Attending: Anesthesiology | Primary: Internal Medicine

## 2021-06-06 ENCOUNTER — Inpatient Hospital Stay
Admit: 2021-06-06 | Discharge: 2021-06-06 | Payer: PRIVATE HEALTH INSURANCE | Attending: Surgery | Primary: Internal Medicine

## 2021-06-06 DIAGNOSIS — G4733 Obstructive sleep apnea (adult) (pediatric): Secondary | ICD-10-CM

## 2021-06-06 DIAGNOSIS — K219 Gastro-esophageal reflux disease without esophagitis: Secondary | ICD-10-CM

## 2021-06-06 DIAGNOSIS — E669 Obesity, unspecified: Secondary | ICD-10-CM

## 2021-06-06 DIAGNOSIS — K649 Unspecified hemorrhoids: Secondary | ICD-10-CM

## 2021-06-06 DIAGNOSIS — Z91018 Allergy to other foods: Secondary | ICD-10-CM

## 2021-06-06 DIAGNOSIS — D120 Benign neoplasm of cecum: Secondary | ICD-10-CM

## 2021-06-06 DIAGNOSIS — Z9109 Other allergy status, other than to drugs and biological substances: Secondary | ICD-10-CM

## 2021-06-06 DIAGNOSIS — I10 Essential (primary) hypertension: Secondary | ICD-10-CM

## 2021-06-06 DIAGNOSIS — Z6833 Body mass index (BMI) 33.0-33.9, adult: Secondary | ICD-10-CM

## 2021-06-06 DIAGNOSIS — K5730 Diverticulosis of large intestine without perforation or abscess without bleeding: Secondary | ICD-10-CM

## 2021-06-06 DIAGNOSIS — Z1211 Encounter for screening for malignant neoplasm of colon: Secondary | ICD-10-CM

## 2021-06-06 DIAGNOSIS — R21 Rash and other nonspecific skin eruption: Secondary | ICD-10-CM

## 2021-06-06 DIAGNOSIS — F17200 Nicotine dependence, unspecified, uncomplicated: Secondary | ICD-10-CM

## 2021-06-06 DIAGNOSIS — D125 Benign neoplasm of sigmoid colon: Secondary | ICD-10-CM

## 2021-06-06 DIAGNOSIS — Z79899 Other long term (current) drug therapy: Secondary | ICD-10-CM

## 2021-06-06 DIAGNOSIS — R011 Cardiac murmur, unspecified: Secondary | ICD-10-CM

## 2021-06-06 LAB — COVID-19 CLEARANCE OR FOR PLACEMENT ONLY: BKR SARS-COV-2 RNA (COVID-19) (YH): NOT DETECTED

## 2021-06-06 MED ORDER — PROPOFOL 10 MG/ML INTRAVENOUS EMULSION
10 mg/mL | INTRAVENOUS | Status: DC | PRN
Start: 2021-06-06 — End: 2021-06-06
  Administered 2021-06-06 (×3): 10 mg/mL via INTRAVENOUS

## 2021-06-06 MED ORDER — PROPOFOL 10 MG/ML INTRAVENOUS EMULSION
10 mg/mL | Status: CP
Start: 2021-06-06 — End: ?

## 2021-06-06 MED ORDER — PROPOFOL 10 MG/ML INTRAVENOUS EMULSION
10 mg/mL | INTRAVENOUS | Status: DC | PRN
Start: 2021-06-06 — End: 2021-06-06
  Administered 2021-06-06: 13:00:00 10 mL/h via INTRAVENOUS

## 2021-06-06 MED ORDER — LIDOCAINE (PF) 20 MG/ML (2 %) INTRAVENOUS SOLUTION
20 mg/mL (2 %) | Status: CP
Start: 2021-06-06 — End: ?

## 2021-06-06 MED ORDER — SODIUM CHLORIDE 0.9 % (FLUSH) INJECTION SYRINGE
0.9 % | Freq: Three times a day (TID) | INTRAVENOUS | Status: DC
Start: 2021-06-06 — End: 2021-06-06

## 2021-06-06 MED ORDER — LACTATED RINGERS INTRAVENOUS SOLUTION
INTRAVENOUS | Status: DC | PRN
Start: 2021-06-06 — End: 2021-06-06
  Administered 2021-06-06: 13:00:00 via INTRAVENOUS

## 2021-06-06 MED ORDER — SODIUM PHOSPHATES 19 GRAM-7 GRAM/118 ML ENEMA
19-7 gram/118 mL | Status: DC | PRN
Start: 2021-06-06 — End: 2021-06-06
  Administered 2021-06-06 (×2): 19-7 gram/118 mL via RECTAL

## 2021-06-06 MED ORDER — LACTATED RINGERS INTRAVENOUS SOLUTION
INTRAVENOUS | Status: DC
Start: 2021-06-06 — End: 2021-06-06
  Administered 2021-06-06: 12:00:00 1000.000 mL/h via INTRAVENOUS

## 2021-06-06 MED ORDER — LIDOCAINE (PF) 20 MG/ML (2 %) INJECTION SOLUTION
20 mg/mL (2 %) | INTRAVENOUS | Status: DC | PRN
Start: 2021-06-06 — End: 2021-06-06
  Administered 2021-06-06: 13:00:00 20 mg/mL (2 %) via INTRAVENOUS

## 2021-06-06 MED ORDER — SODIUM CHLORIDE 0.9 % (FLUSH) INJECTION SYRINGE
0.9 % | INTRAVENOUS | Status: DC | PRN
Start: 2021-06-06 — End: 2021-06-06

## 2021-06-06 NOTE — Discharge Instructions
Patient Education Colonoscopy Discharge Instructions About this topic Colonoscopy is done so your doctor can see the inside of your large intestines, also called your colon, and your rectum. It uses a lighted tube called a scope, which has a tiny camera that can be moved through the large intestine. This may be done ZO:XWRUEA for colon cancer or polypsLook for the source of rectal bleedingFind the cause of changes in your bowel movementFind the cause of belly or rectal painCheck results from other testsCheck your response to treatment for other diseases What care is needed at home? Ask your doctor what you need to do when you go home. Make sure you ask questions if you do not understand what the doctor says. This way you will know what you need to do.Rest. Do not drive the rest of the day. Do not sign any important papers or make any important decisions for the next 24 hours.You may feel groggy. Take extra care when moving about.You may have gas or mild cramping. This is normal.A small amount of bleeding may happen during the first few days after your procedure.Start back on your normal diet unless you need some changes in your diet.What follow-up care is needed? Your doctor will talk to you about your test results. Your doctor may ask you to make visits to the office to check on your progress. Be sure to keep these visits.Ask your doctor when you need to have another colonoscopy. The amount of time between tests is based on what was found during this test. People with no polyps may be able to wait 10 years to have another colonoscopy. Other people may need to have this test repeated in 1 year because of the kind of polyps that were found in their colon. Ask your doctor when you need to come back.What drugs may be needed? Ask your doctor what drugs you will need to take. Take your drugs as told by your doctor.Will physical activity be limited? Physical activities may be limited for a short time. Rest after the procedure. You may go back to your normal activities within a day or so.What changes to diet are needed? Drink 6 to 8 glasses of water each day.Eat food rich in fiber like fresh fruits and vegetables.What problems could happen? Tear inside your colonBleeding can happen up to a few days afterwardsWhen do I need to call the doctor? Fever of 100.4?F (38?C) or higher, chillsBleeding during bowel movements (1 teaspoon (5 mL) or more) or maroon stoolThrowing up more than 3 times in the next 48 hoursFeeling dizzyFeeling weakBelly pain that is getting worseNot able to have a bowel movement for more than 2 daysHard swollen bellyTeach Back: Helping You Understand The Teach Back Method helps you understand the information we are giving you. After you talk with the staff, tell them in your own words what you learned. This helps to make sure the staff has described each thing clearly. It also helps to explain things that may have been confusing. Before going home, make sure you can do these:I can tell you about my procedure.I can tell you what signs are normal after my procedure.I can tell you what I will do if I throw up more than 3 times in the next 48 hours or I have more belly pain.Where can I learn more? American Cancer Societyhttps://www.cancer.org/cancer/colon-rectal-cancer/detection-diagnosis-staging/screening-tests-used.html American Society of Clinical Oncologyhttps://www.cancer.net/navigating-cancer-care/diagnosing-cancer/tests-and-procedures/colonoscopy Last Reviewed Date 2021-03-10Consumer Information Use and Disclaimer This generalized information is a limited summary of diagnosis, treatment, and/or medication information. It is not meant  to be comprehensive and should be used as a tool to help the user understand and/or assess potential diagnostic and treatment options. It does NOT include all information about conditions, treatments, medications, side effects, or risks that may apply to a specific patient. It is not intended to be medical advice or a substitute for the medical advice, diagnosis, or treatment of a health care provider based on the health care provider's examination and assessment of a patient?s specific and unique circumstances. Patients must speak with a health care provider for complete information about their health, medical questions, and treatment options, including any risks or benefits regarding use of medications. This information does not endorse any treatments or medications as safe, effective, or approved for treating a specific patient. UpTo[employer], Inc. and its affiliates disclaim any warranty or liability relating to this information or the use thereof. The use of this information is governed by the Terms of Use, available at https://www.wolterskluwer.com/en/know/clinical-effectiveness-terms Copyright Copyright ? 2022 UpToDate, Inc. and its affiliates and/or licensors. All rights reserved.

## 2021-06-06 NOTE — Other
Operative Diagnosis:Pre-op:   * No pre-op diagnosis entered * Patient Coded Diagnosis   Pre-op diagnosis: Personal history of colonic polyps  Post-op diagnosis: Personal history of colonic polyps  Patient Diagnosis   None    Post-op diagnosis:   * Personal history of colonic polyps [Z86.010]Operative Procedure(s) :Procedure(s) (LRB):COLONOSCOPY, FLEXIBLE; DX, W/WO SPECIMENS/COLON DECOMP (SEP PROC) (N/A)Post-op Procedure & Diagnosis ConfirmationPost-op Diagnosis: Post-op Diagnosis confirmed (no changes)Post-op Procedure: Post-op Procedure confirmed (no changes)Anesthesia ClarifiersGI/Endoscopy: Planned Screening Colonoscopy - Procedure Performed (see above)

## 2021-06-06 NOTE — Other
Post Anesthesia Transfer of Care NotePatient: Martin Calhoun) Performed: Procedure(s) (LRB):COLONOSCOPY, FLEXIBLE; DX, W/WO SPECIMENS/COLON DECOMP (SEP PROC) (N/A) Patient location: PACU Last Vitals: Vitals Value Taken Time BP 169/92 06/06/21 0934 Temp  06/06/21 0935 Pulse 65 06/06/21 0934 Resp 20 06/06/21 0934 SpO2 98 % 06/06/21 0934 Level of consciousness: awake and alert Transport Vital Signs:  Stable since the last set of recorded intra-operative vital signsIntra-operative Complications: noneIntra-operative Intake & Output and Antibiotics as per Anesthesia record and discussed with the RN.

## 2021-06-06 NOTE — Anesthesia Pre-Procedure Evaluation
This is a 60 y.o. male scheduled for COLONOSCOPY, FLEXIBLE; DX, W/WO SPECIMENS/COLON DECOMP (SEP PROC) (N/A ).Review of Systems/ Medical HistoryPatient summary, EKG/Cardiac Studies , Labs, pre-procedure vitals, height, weight and NPO status reviewed.No previous anesthesia concernsAnesthesia Evaluation: Estimated body mass index is 33.38 kg/m? as calculated from the following:  Height as of this encounter: 6' 2 (1.88 m).  Weight as of this encounter: 117.9 kg. CC/HPI: 63 M with h/o colon polypsWas told he had he had heart murmur yrs agoCurrent smoker - half pack a day for 10 yrsCurrent smokerPast Surgical History:  ColonoscopyCardiovascular:Patient has a history of: hypertension.  No angina. -Exercise tolerance: >4 METS -Coronary Artery Disease:  No history of MI -Vascular Disease:  Negative   -Other Cardiovascular: . Chart reviewed by Dr. Kae Heller, The five year old TTE is essentially close to normal. The colonoscopy is a very low risk procedure, so preop ECG not required. I have no way to assess the current status of this patient. To be evaluated on Day of procedure. Echo 2016Normal left ventricular size, thickness, systolic function, and wall motion. LVEF calculated by biplane Simpson's is 60%.* Mild diastolic dysfunction, consistent with relaxation abnormality.* Normal right ventricular cavity size. There is normal right ventricular systolic function.* The left atrium is mildly dilated.  The LA volume index is 36 ml/m2.* There is mild aortic valve regurgitation.* IVC diameter < 2.1 cm that collapses > 50% with a sniff suggests normal RAP (0-5 mmHg, mean 3 mmHg).* No evidence of pericardial effusion.* Changes noted compared with prior study dated:  09/26/2011.  Left atrium is now enlarged.Marland Kitchen  Respiratory: The patient had a no recent URI -Obstructive Sleep apnea:   yes (non-compliant)-Airway disorders: -Asthma: noHEENT: Negative.Neuromuscular: -Intracranial disorders:  He did not have a cerebrovascular accidentGastrointestinal/Genitourinary: -Gastrointestinal Disorders:  Patient has colon polyps. Patient has GERD.-Nutritional Disorders: Patient has has increased body weight- obesity.Endocrine/Metabolic: -Thyroid Disorders:  Patient has no hypothyroidism.Physical ExamCardiovascular:    Rhythm: regularHeart Sounds: S1 present and S2 present.Pulmonary:  normal exam  Airway:  Mallampati: IIITM distance: >3 FBNeck ROM: fullDental:  Dentition: missing and chippedOther Findings: MP2.5 with vocalization, large tongueAnesthesia PlanASA 2 The primary anesthesia plan is  MAC. TIVA-MAC/GA with propofol, SASAMPerioperative Code Status confirmed: It is my understanding that the patient is currently designated as 'Full Code' and will remain so throughout the perioperative period.Anesthesia informed consent obtained. Consent obtained from: patientUse of blood products: consented    Consent Comment: Anesthetic plan discussed with the patient in detail. He/She understands the benefits & risks of anesthesia, and he/she consented to the planned anesthetic. Questions answered and all concerns addressedPlan discussed with CRNA.Anesthesiologist's Pre Op NoteI personally evaluated and examined the patient prior to the intra-operative phase of care.

## 2021-06-06 NOTE — Other
Colonoscopy performed under anesthesia. Enema x2 given via colonoscope by MD. Polyps removed with cold snare and forceps.

## 2021-06-06 NOTE — Anesthesia Post-Procedure Evaluation
Anesthesia Post-op NotePatient: Martin Calhoun):  Procedure(s) (LRB):COLONOSCOPY, FLEXIBLE; DX, W/WO SPECIMENS/COLON DECOMP (SEP PROC) (N/A) Patient location: PACULast Vitals:  I have noted the vital signs as listed in the nursing notes.Mental status recovered: patient participates in evaluation: YesVital signs reviewed: YesRespiratory function stable:YesAirway is patent: YesCardiovascular function and hydration status stable: YesPain control satisfactory: YesNausea and vomiting control satisfactory:YesThere were no known complications for this encounter.

## 2021-06-23 ENCOUNTER — Encounter: Payer: Self-pay | Admitting: Primary Care

## 2021-07-02 ENCOUNTER — Other Ambulatory Visit
Admission: RE | Admit: 2021-07-02 | Discharge: 2021-07-02 | Disposition: A | Discharge status: Home / Self Care | Payer: Commercial Managed Care - PPO | Source: Ambulatory Visit | Attending: Primary Care | Admitting: Primary Care

## 2021-07-02 DIAGNOSIS — E7800 Pure hypercholesterolemia, unspecified: Secondary | ICD-10-CM | POA: Insufficient documentation

## 2021-07-02 DIAGNOSIS — Z0000 Encounter for general adult medical examination without abnormal findings: Secondary | ICD-10-CM

## 2021-07-02 DIAGNOSIS — Z125 Encounter for screening for malignant neoplasm of prostate: Secondary | ICD-10-CM | POA: Insufficient documentation

## 2021-07-02 LAB — PSA (EFF.4-2010): PSA (eff. 4-2010): 1.66 ng/mL (ref 0.00–4.00)

## 2021-07-02 LAB — BASIC METABOLIC PANEL
Anion Gap: 11 (ref 7–16)
CO2: 27 mmol/L (ref 20–28)
Calcium: 9.4 mg/dL (ref 8.6–10.2)
Chloride: 103 mmol/L (ref 96–108)
Creatinine: 1.19 mg/dL — ABNORMAL HIGH (ref 0.67–1.17)
Glucose: 93 mg/dL (ref 60–99)
Lab: 11 mg/dL (ref 6–20)
Potassium: 4.4 mmol/L (ref 3.3–5.1)
Sodium: 141 mmol/L (ref 133–145)
eGFR BY CREAT: 70 *

## 2021-07-02 LAB — LIPID PANEL
Chol/HDL Ratio: 2.5
Cholesterol: 130 mg/dL
HDL: 51 mg/dL (ref 40–60)
LDL Calculated: 67 mg/dL
Non HDL Cholesterol: 79 mg/dL
Triglycerides: 59 mg/dL

## 2021-07-21 ENCOUNTER — Ambulatory Visit: Payer: Commercial Managed Care - PPO | Admitting: Primary Care

## 2021-07-21 ENCOUNTER — Encounter: Payer: Self-pay | Admitting: Primary Care

## 2021-07-21 ENCOUNTER — Other Ambulatory Visit: Payer: Self-pay

## 2021-07-21 VITALS — BP 130/81 | HR 89 | Ht 60.0 in | Wt 187.0 lb

## 2021-07-21 DIAGNOSIS — D126 Benign neoplasm of colon, unspecified: Secondary | ICD-10-CM

## 2021-07-21 DIAGNOSIS — Z0000 Encounter for general adult medical examination without abnormal findings: Secondary | ICD-10-CM

## 2021-07-21 DIAGNOSIS — J4540 Moderate persistent asthma, uncomplicated: Secondary | ICD-10-CM

## 2021-07-21 DIAGNOSIS — E559 Vitamin D deficiency, unspecified: Secondary | ICD-10-CM

## 2021-07-21 DIAGNOSIS — N182 Chronic kidney disease, stage 2 (mild): Secondary | ICD-10-CM

## 2021-07-21 DIAGNOSIS — E7800 Pure hypercholesterolemia, unspecified: Secondary | ICD-10-CM

## 2021-07-21 NOTE — Patient Instructions (Addendum)
Schedule your colonoscopy with the [facility] at your convenience.  If they can't get you in or you have trouble reaching them let me know.   I'm giving you the number to call them   [address]  [address]  [place], [place] [address]  Phone: [phone]        I put in blood work orders for next year.  Please note the blood work orders will expire in about 13 months and it's probable the physical will be more then 13 months from now.   Do the blood work next year sometime between 75 and 13 months from now or call me to renew if you are going after that time frame      I encourage you to get your COVID booster       We discussed the option of having you get the    shingles vaccine Shingrix.     This is a two shot series.    This vaccine  replaces the old shingles vaccine.   As we discussed in the world of COVID there are some important pros and cons to consider on when you would want to get this vaccine as the post vaccine side effects may be mistaken for COVID.      As we discussed this vaccine does have some significant short lived side effects for some people who receive this.  Some people who received this vaccine felt tired, had muscle pain, a headache, shivering, fever, stomach pain, or nausea. Symptoms went away on their own in about 2 to 3 days.   About 1 out of 6 people who got Shingrix experienced side effects that prevented them from doing regular activities.  Side effects are more common in younger people.       If you decide you want to get it you will need to check with your insurance company about coverage for Shingrix when you call them confirm WHERE they cover it not just if they cover it.  Some insurances pay for it here sometimes some only pay for you to get it from the pharmacy.  Others do not cover it.    I'm happy to order it however it is cheapest for you to get.    If you have no or only partial coverage you may want to look at the prices at the [facility] department of health they may  be cheaper for you.

## 2021-07-21 NOTE — Progress Notes (Signed)
Patient ID: Carl Olson is a 60 y.o. year old male who presents today forAnnual Exam    BP 130/81 (BP Location: Left arm, Patient Position: Sitting, Cuff Size: large adult)    Pulse 89    Ht 1.524 m (5')    Wt 84.8 kg (187 lb)    BMI 36.52 kg/m   Estimated body mass index is 36.52 kg/m as calculated from the following:    Height as of this encounter: 1.524 m (5').    Weight as of this encounter: 84.8 kg (187 lb).    SUBJECTIVE         60 year old with history of high cholesterol, asthma, reflux, allergic rhinitis    Patient presents for preventive health care visit.    Asthma - doing better then usual over the summer possibly due to allergen exposure reduction.     CHOL Reports medication compliance.     Allergic rhinitis   Doing fine      He has some aching shoulders also some sternal soreness is back (he had similar symptoms in the spring he thinks this is due to heavier jobs he did recently and was by himself for two days.     Wife was in ER for hours earlier this week with abdominal pain she is doing better now.   He has been very busy.          Medications, allergies, past medical history, social history reviewed and updated in electronic record and full ROS reviewed with patient Review of Systems -   Review of Systems   Constitutional: Negative.    HENT: Negative.    Eyes: Negative.    Respiratory: Negative.    Cardiovascular: Positive for chest pain (muscular chest discomfort ). Negative for palpitations, orthopnea, claudication and leg swelling.   Gastrointestinal: Negative.  Negative for constipation and diarrhea.   Genitourinary: Negative.    Musculoskeletal: Positive for joint pain and myalgias. Negative for back pain, falls and neck pain.   Skin: Negative.    Neurological: Negative for dizziness and seizures.   Endo/Heme/Allergies: Negative.  Negative for environmental allergies and polydipsia. Does not bruise/bleed easily.   Psychiatric/Behavioral: Negative.  Negative for depression, memory loss  and suicidal ideas.       Allergy / Social History / Medications:     Allergies   Allergen Reactions    Moxifloxacin Other (See Comments)     Penile glans swelling     Family History   Problem Relation Age of Onset    High Blood Pressure Mother     Diabetes Mother     Dementia Mother     No Known Problems Father     High Blood Pressure Sister     High Blood Pressure Sister     Diabetes Sister     High Blood Pressure Sister     High Blood Pressure Sister     High Blood Pressure Sister     Colon cancer Neg Hx     Colon polyps Neg Hx      Past Medical History:   Diagnosis Date    Asthma     Colonic polyp tubular adenoma next scope due 2022 November  12/27/2016    2019 colonoscopy  FINAL DIAGNOSIS:  (A) Cecum, polyp, biopsy:    - Tubular adenoma.  (B) Colon, right, polyps, biopsy:    - Tubular adenomas.  (C) Colon, transverse, polyps, biopsy,    - Tubular adenomas.    57[address][age]  Surgical Pathology  FINAL DIAGNOSIS: Colon, cecum, biopsy: - Tubular adenoma.       Esophageal reflux 01/29/2006    High cholesterol 04/04/2007    Lower GI bleed 06/2018    After colonoscopy with polypectomy     Vitamin D deficiency 05/05/2008         Social History     Social History Narrative    Married since 1988, two step daughters.  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  Cow[employer] football fan, enjoys casino's.    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         Social History     Tobacco Use    Smoking status: Never    Smokeless tobacco: Never   Substance Use Topics    Alcohol use: Yes     Comment: 1 beer every few months              OBJECTIVE     Vitals:    07/21/21 0906   BP: 130/81   Pulse: 89   Weight: 84.8 kg (187 lb)   Height: 1.524 m (5')       General: Well-developed/well-nourished  .  No acute distress.  Appears stated age.  Skin: No rash.  No atypical nevi. *   Lymph nodes: No cervical, submandibular, supraclavicular, axillary, inguinal  lymphadenopathy.  HEENT: Pupils symmetric.  Conjunctiva pink.  Sclera anicteric.  Turbinates without edema or erythema.    Neck: No thyromegaly or thyroid nodules. No other masses, no crepitus. No carotid bruits.  No jugular venous distention.  Chest/Breast:   Recumbent exams.  No mass or tenderness.  Skin without dimpling or retraction.  No nipple discharge.  Lungs: Clear to auscultation bilaterally, normal respiratory effort, no wheeze or rales  Cardiac: Regular S1 and S2 with no murmur, rub, gallop.  PMI nondisplaced.  Abdomen: Soft, non tender, non distended.  No palpable HSM or mass.  Normal active bowel sounds.  Genitals: Testes descended bilaterally, no palpable mass or tenderness.  Penis without lesions or discharge.  Rectal/prostate: Prostate is non tender.  Prostate is not enlarged.  No palpable nodule.  No rectal mass.   Marland Kitchen  Spine/musculoskeletal: Spine is non tender.  No joint deformities.  Good functional range of motion  Extremities: No clubbing, cyanosis, edema.  Vascular: 2+ radial, brachial, femoral, dorsalis pedis pulses bilaterally.  Neurologic: Patient is alert and oriented.  Sensation intact to light touch  Gait is without ataxia.     Psych:  Appropriate historian, normal affect and interaction, good eye contact.       Recent Labs and Other Results     Lab Results   Component Value Date    NA 141 07/02/2021    K 4.4 07/02/2021    CL 103 07/02/2021    CO2 27 07/02/2021    UN 11 07/02/2021    CREAT 1.19 (H) 07/02/2021    VID25 48 06/05/2020    WBC 8.1 08/26/2018    HGB 14.9 08/26/2018    HCT 47 08/26/2018    PLT 256 08/26/2018    TSH 1.44 08/26/2018    CHOL 130 07/02/2021    TRIG 59 07/02/2021    HDL 51 07/02/2021    LDLC 67 07/02/2021    CHHDC 2.5 07/02/2021     Hemoglobin A1C   Date Value Ref Range Status   12/28/2016 6.0 4.0 - 6.0 % Final  Comment:                                   NON-DIABETIC       EKG sinus axis positive rate 75 borderline prolonged PR        ASSESSMENT AND PLAN     1.  Health care maintenance  Basic metabolic panel    Lipid Panel (Reflex to Direct  LDL if Triglycerides more than 400)    PSA (eff.11-2008)    Vitamin D      2. High cholesterol  Lipid Panel (Reflex to Direct  LDL if Triglycerides more than 400)      3. Adenomatous polyp of colon, unspecified part of colon  AMB REFERRAL [facility]      4. CKD (chronic kidney disease) stage 2, GFR 60-89 ml/min  Basic metabolic panel      5. Vitamin D deficiency  Vitamin D      6. Moderate persistent asthma without complication                Health Maintenance  Preventive health guidelines reviewed with patient and highlighted particular issues  pertinent to their health, gender, and age.    Discussed preventive lab testing and immunizations. See discussion below and orders for particular items discussed at today's visit.      Items discussed include (but are not limited only to) those indicated below     Health care proxy / advance directives  Reviewed on file    Lifestyle modification including diet and exercise -    Skin CA awareness/prevention discussed  --Sunscreen use / long sleeved clothing  --Monitor moles for the following changes:    A: asymmetry   B: border changes   C: color changes    D: diameter greater than a pencil eraser size    E: evolving, or changing mole       Seat belt use counseled         Gun safe storage review offered     Dentistry yearly advised    [place] every 1-2 years advised          Reviewed blood work on file / discussed additional blood tests        EKG for those with HTN/significant CVD risk factors or age > 45     Colon CA screening starting at 45     Overdue       Prostate cancer screening recommended given age and race   Testicular self exam discussed           Vaccinations:  Immunization History   Administered Date(s) Administered    Covid-19 mRNA vaccine (PFIZER) IM 30 mcg/0.76m 10/31/2019, 11/23/2019    Influenza Adult(366yrnd up) 06/05/2011    Influenza  Quadrivalent 0.54m83mrefilled syringe/single dose vial(FluLaval,Fluzone,Afluria,Fluarix) 06/28/2012, 07/20/2015, 07/13/2016, 06/29/2017, 06/30/2018, 05/15/2019, 06/14/2020, 05/05/2021    Influenza Whole 07/08/2007, 05/04/2008, 05/06/2009    Influenza multi-dose vial 06/12/2013    MMR 06/14/2020    Pneumococcal Polysaccharide (Pneumovax) 06/29/2017    Td 07/05/2018    Tdap 03/02/2008     COVID Vaccination   Recommended booster again he declines   Yearly flu shot advised        Shingrix vaccination discussed and advised for patients 50 and older check coverage         CV Risk stratification  Reviewed 2018 AHA cholesterol management guidelines  with patient and discussed on my review how I think they apply to this patients care.   Lab Results   Component Value Date    CHOL 130 07/02/2021    HDL 51 07/02/2021      Risk calculation tool done and discussed with patient.    The 10-year ASCVD risk score (Arnett DK, et al., 2019) is: 7.1%    Values used to calculate the score:      Age: 58 years      Sex: Male      Is Non-Hispanic African American: Yes      Diabetic: No      Tobacco smoker: No      Systolic Blood Pressure: 735 mmHg      Is BP treated: No      HDL Cholesterol: 51 mg/dL      Total Cholesterol: 130 mg/dL  We reviewed potential  Risk Enhancers       Chronic kidney disease      COVID safety discussed (Appropriate mask use, use of rapid testing, vaccinations, use of [facility] [employer])         Orders and Meds       Outpatient Encounter Medications as of 07/21/2021   Medication Sig Dispense Refill    PULMICORT FLEXHALER 180 MCG/ACT flexhaler INHALE 1 PUFF INTO THE LUNGS TWICE DAILY 1 each 3    mometasone (NASONEX) 50 MCG/ACT nasal spray SHAKE LIQUID AND USE 2 SPRAYS IN EACH NOSTRIL DAILY 51 g 1    atorvastatin (LIPITOR) 40 mg tablet TAKE 1 TABLET(40 MG) BY MOUTH DAILY 90 tablet 3    cholecalciferol (VITAMIN D) 1,000 unit capsule Take 1 capsule (1,000 units total) by mouth daily 100 capsule 5     cetirizine (ZYRTEC) 10 MG tablet Take 10 mg by mouth daily      albuterol HFA (PROVENTIL, VENTOLIN, PROAIR HFA) 108 (90 Base) MCG/ACT inhaler Inhale 1-2 puffs into the lungs every 4 hours as needed for Wheezing Shake well before each use. 1 each 3    montelukast (SINGULAIR) 10 MG tablet Take 1 tablet (10 mg total) by mouth nightly as needed (asthma and allergies) (Patient not taking: No sig reported) 30 tablet 5     No facility-administered encounter medications on file as of 07/21/2021.         Signed: Caroll Rancher, MD on 07/21/2021 at 9:31 AM

## 2021-12-09 ENCOUNTER — Encounter: Payer: Self-pay | Admitting: Medical

## 2021-12-09 ENCOUNTER — Emergency Department
Admission: EM | Admit: 2021-12-09 | Discharge: 2021-12-09 | Disposition: A | Discharge status: Home / Self Care | Payer: Commercial Managed Care - PPO | Source: Ambulatory Visit | Attending: Emergency Medicine | Admitting: Emergency Medicine

## 2021-12-09 ENCOUNTER — Other Ambulatory Visit: Payer: Self-pay

## 2021-12-09 ENCOUNTER — Emergency Department: Payer: Commercial Managed Care - PPO

## 2021-12-09 DIAGNOSIS — Z789 Other specified health status: Secondary | ICD-10-CM

## 2021-12-09 DIAGNOSIS — R0789 Other chest pain: Secondary | ICD-10-CM | POA: Insufficient documentation

## 2021-12-09 DIAGNOSIS — R079 Chest pain, unspecified: Secondary | ICD-10-CM

## 2021-12-09 DIAGNOSIS — R42 Dizziness and giddiness: Secondary | ICD-10-CM

## 2021-12-09 DIAGNOSIS — R0602 Shortness of breath: Secondary | ICD-10-CM | POA: Insufficient documentation

## 2021-12-09 LAB — CBC AND DIFFERENTIAL
Baso # K/uL: 0.1 10*3/uL (ref 0.0–0.1)
Basophil %: 0.7 %
Eos # K/uL: 0.1 10*3/uL (ref 0.0–0.5)
Eosinophil %: 0.7 %
Hematocrit: 45 % (ref 40–51)
Hemoglobin: 14.7 g/dL (ref 13.7–17.5)
IMM Granulocytes #: 0 10*3/uL (ref 0.0–0.0)
IMM Granulocytes: 0.3 %
Lymph # K/uL: 1.5 10*3/uL (ref 1.3–3.6)
Lymphocyte %: 19.9 %
MCH: 30 pg (ref 26–32)
MCHC: 33 g/dL (ref 32–37)
MCV: 90 fL (ref 79–92)
Mono # K/uL: 0.6 10*3/uL (ref 0.3–0.8)
Monocyte %: 8.3 %
Neut # K/uL: 5.2 10*3/uL (ref 1.8–5.4)
Nucl RBC # K/uL: 0 10*3/uL (ref 0.0–0.0)
Nucl RBC %: 0 /100 WBC (ref 0.0–0.2)
Platelets: 210 10*3/uL (ref 150–330)
RBC: 5 MIL/uL (ref 4.6–6.1)
RDW: 13.5 % (ref 11.6–14.4)
Seg Neut %: 70.1 %
WBC: 7.3 10*3/uL (ref 4.2–9.1)

## 2021-12-09 LAB — BASIC METABOLIC PANEL
Anion Gap: 8 (ref 7–16)
CO2: 26 mmol/L (ref 20–28)
Calcium: 9.4 mg/dL (ref 8.6–10.2)
Chloride: 107 mmol/L (ref 96–108)
Creatinine: 1.08 mg/dL (ref 0.67–1.17)
Glucose: 91 mg/dL (ref 60–99)
Lab: 11 mg/dL (ref 6–20)
Potassium: 4.4 mmol/L (ref 3.3–5.1)
Sodium: 141 mmol/L (ref 133–145)
eGFR BY CREAT: 79 *

## 2021-12-09 LAB — TROPONIN T 0 HR HIGH SENSITIVITY (IP/ED ONLY): TROP T 0 HR High Sensitivity: 8 ng/L (ref 0–11)

## 2021-12-09 LAB — TROPONIN T 3 HR W/ DELTA HIGH SENSITIVITY (IP/ED ONLY)

## 2021-12-09 LAB — TROPONIN T 1 HR W/ DELTA HIGH SENSITIVITY

## 2021-12-09 MED ORDER — KETOROLAC TROMETHAMINE 30 MG/ML IJ SOLN *I*
15.0000 mg | Freq: Once | INTRAMUSCULAR | Status: DC
Start: 2021-12-09 — End: 2021-12-09

## 2021-12-09 MED ORDER — ACETAMINOPHEN 500 MG PO TABS *I*
1000.0000 mg | ORAL_TABLET | Freq: Once | ORAL | Status: AC
Start: 2021-12-09 — End: 2021-12-09
  Administered 2021-12-09: 1000 mg via ORAL
  Filled 2021-12-09: qty 2

## 2021-12-09 MED ORDER — FAMOTIDINE (PF) 20 MG/2ML IV SOLN *I*
20.0000 mg | Freq: Once | INTRAVENOUS | Status: AC
Start: 2021-12-09 — End: 2021-12-09
  Administered 2021-12-09: 20 mg via INTRAVENOUS
  Filled 2021-12-09: qty 2

## 2021-12-09 MED ORDER — DEXTROSE 5 % FLUSH FOR PUMPS *I*
0.0000 mL/h | INTRAVENOUS | Status: DC | PRN
Start: 2021-12-09 — End: 2021-12-09

## 2021-12-09 MED ORDER — SODIUM CHLORIDE 0.9 % FLUSH FOR PUMPS *I*
0.0000 mL/h | INTRAVENOUS | Status: DC | PRN
Start: 2021-12-09 — End: 2021-12-09

## 2021-12-09 NOTE — Discharge Instructions (Signed)
You are seen in the emergency department today for your chest pain.  You had labs which are reassuring.  Your heart enzymes are not elevated today.  Your EKG showed normal heart rhythm.  You an x-ray which did not show any abnormalities.  Your chest pain may be musculoskeletal in nature.  Please continue taking Tylenol 1000 mg 3 times daily.  Please do not exceed 3000 mg within a 24-hour period.  Please follow-up with your primary care doctor within the next 3 to 4 days.  Please return to the emergency department if you experience worsening symptoms.

## 2021-12-09 NOTE — ED Provider Notes (Signed)
History     Chief Complaint   Patient presents with   . Chest Pain   . Dizziness     Carl Olson is a 61 year old male with a past medical history significant for stage II CKD, asthma, GERD, and HLD who presents to the ED today with intermittent chest pain x 1 week. Also having associated dizziness and shortness of breath. Symptoms began one week ago and seem to come and go randomly. Patient states that he will be pain free for several hours, and then pain will start at rest. Pain is located in the center of his chest. Patient states that he has similar pain in 2021. He had a normal stress Echo 06/2020. No history of CAD. Patient does not smoke cigarettes or use tobacco products. No recent travel. No personal or family hx of DVT/PE. No lower extremity edema. Hx of GERD but does not take any medications for this. Patient states that he does have a physically demanding job and often needs to lift heavy things.      History provided by:  Patient  Language interpreter used: No      Dizziness  Features:   - Additional neurologic symptoms: Absent      Medical/Surgical/Family History     Past Medical History:   Diagnosis Date   . Asthma    . Colonic polyp tubular adenoma next scope due 2022 November  12/27/2016    2019 colonoscopy  FINAL DIAGNOSIS:  (A) Cecum, polyp, biopsy:    - Tubular adenoma.  (B) Colon, right, polyps, biopsy:    - Tubular adenomas.  (C) Colon, transverse, polyps, biopsy,    - Tubular adenomas.    75yr ago  Surgical Pathology  FINAL DIAGNOSIS: Colon, cecum, biopsy: - Tubular adenoma.      . Esophageal reflux 01/29/2006   . High cholesterol 04/04/2007   . Lower GI bleed 06/2018    After colonoscopy with polypectomy    . Vitamin D deficiency 05/05/2008        Patient Active Problem List   Diagnosis Code   . Allergic rhinitis J30.9   . Asthma J45.909   . High cholesterol E78.00   . Vitamin D deficiency E55.9   . Health care maintenance Z00.00   . Colonic polyp tubular adenoma next scope due 2022  November  K63.5   . Obesity, unspecified E66.9   . CKD (chronic kidney disease) stage 2, GFR 60-89 ml/min N18.2            Past Surgical History:   Procedure Laterality Date   . ENDOSCOPY, COLON, WITH POLYPECTOMY 16109  06/28/2018        . LIPOMA RESECTION Left     left shoulder     Family History   Problem Relation Age of Onset   . High Blood Pressure Mother    . Diabetes Mother    . Dementia Mother    . No Known Problems Father    . High Blood Pressure Sister    . High Blood Pressure Sister    . Diabetes Sister    . High Blood Pressure Sister    . High Blood Pressure Sister    . High Blood Pressure Sister    . Colon cancer Neg Hx    . Colon polyps Neg Hx           Social History     Tobacco Use   . Smoking status: Never   . Smokeless tobacco: Never   Substance  Use Topics   . Alcohol use: Yes     Comment: 1 beer every few months    . 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   Respiratory: Positive for shortness of breath. Negative for cough.    Cardiovascular: Positive for chest pain. Negative for palpitations and leg swelling.   Skin: Negative for color change, pallor and rash.       Physical Exam     Triage Vitals  Triage Start: Start, (12/09/21 1132)  First Recorded BP: (!) 195/100, Resp: 14, Temp: 36.5 C (97.7 F) Oxygen Therapy SpO2: 99 %, O2 Device: None (Room air), Heart Rate: 92, (12/09/21 1134)  .      Physical Exam  Vitals and nursing note reviewed.   Constitutional:       General: He is not in acute distress.     Appearance: He is well-developed. He is not toxic-appearing.   HENT:      Head: Normocephalic and atraumatic.   Cardiovascular:      Rate and Rhythm: Normal rate and regular rhythm.      Heart sounds: Normal  heart sounds. Heart sounds not distant. No murmur heard.  Pulmonary:      Effort: Pulmonary effort is normal. No tachypnea, accessory muscle usage or respiratory distress.      Breath sounds: Normal breath sounds. No decreased breath sounds, wheezing, rhonchi or rales.   Chest:      Chest wall: Tenderness (Tenderness over the sternum) present. No mass.   Abdominal:      General: Bowel sounds are normal.      Palpations: Abdomen is soft.   Musculoskeletal:         General: Normal range of motion.      Right lower leg: No tenderness. No edema.      Left lower leg: No tenderness. No edema.   Skin:     General: Skin is warm and dry.      Coloration: Skin is not cyanotic.      Findings: No ecchymosis.   Neurological:      General: No focal deficit present.      Mental Status: He is alert and oriented to person, place, and time.   Psychiatric:         Mood and Affect: Mood normal.         Behavior: Behavior normal.       Expanded Neurologic Exam    Cranial Nerves:   II: PERRL, visual field exam: Intact  III/IV/VI: EOM intact. No nystagmus. Eyelids open equal.  V: Facial sensation symmetric to light touch   VII: No Facial droop   VIII: Hearing intact bilaterally  IX/X: Normal swallowing, normal voice  XI: Equal shoulder shrug  XII: Tongue midline    AOx3  Speech: Normal  Strength intact x 4  Sensation intact x 4  Pronator drift was absent  Coordination: Finger to nose intact   Gait: Intact  Truncal Stability: able to sit upright  Medical Decision Making   Patient seen by me on:  12/09/2021    Assessment:  Trigo is a 61 year old male with a past medical history significant for stage II CKD, asthma, GERD, and HLD who presents to the ED today with intermittent chest pain x 1 week. Also having associated dizziness and shortness of breath. Symptoms began one week ago and seem to come and go randomly. Patient states that he will be pain free for several hours, and then pain will start at rest. Pain is  located in the center of his chest. Patient states that he has similar pain in 2021. He had a normal stress Echo 06/2020.     Differential diagnosis:  ACS  Atypical chest pain  MSK pain  GERD    Plan:  Orders Placed This Encounter      *Chest standard frontal and lateral views      CBC and differential      Basic metabolic panel      Troponin T 0 HR High Sensitivity      Troponin T 1 HR W/ Delta High Sensitivity      Troponin T 3 HR W/ Delta High Sensitivity      EKG: initial      EKG: follow up      EKG 12 lead (initial)      Insert peripheral IV        EKG Interpretation: I have independently reviewed the EKG in which I appreciated a regular rhythm, normal intervals, no ST segment changes, no T-wave changes. EKG has been reviewed by ED attending who concurs, normal sinus rhythm, no ischemic changes    ED Course and Disposition:  Labs Reviewed  CBC AND DIFFERENTIAL  BASIC METABOLIC PANEL  TROPONIN T 0 HR HIGH SENSITIVITY (IP/ED ONLY)  TROPONIN T 1 HR W/ DELTA HIGH SENSITIVITY  TROPONIN T 3 HR W/ DELTA HIGH SENSITIVITY (IP/ED ONLY)    *Chest standard frontal and lateral views   Final Result        No acute disease in the chest.        END OF IMPRESSION                [facility] submits this DICOM format image data and final report to the [facility], an independent secure electronic health information exchange, on a reciprocally searchable basis (with patient authorization) for a minimum of 12 months after exam     date.       In the emergency department, patient is hemodynamically stable.  No fever, tachycardia, or hypotension.    On exam, heart is regular in rate and rhythm.  No murmurs or gallops.  Lungs are clear to auscultation bilaterally.  Patient has tenderness palpation of the sternum.    Labs are reassuring.  No leukocytosis or electrolyte abnormalities.  Renal function is at baseline.  Troponin is 8.    EKG demonstrates normal sinus rhythm.    CXR is negative for acute abnormalities.     Discussed  reassuring workup with patient. Patient reassessed and his symptoms have resolved. Recommend for patient to follow up with PCP within 2-3 days. ED precautions discussed.      I had a detailed discussion with the patient and/or guardian regarding the historical points, exam findings, and any diagnostic results supporting the discharge diagnosis. Based on the patient's history, exam, and diagnositic evaluation, there is no indication for further emergent intervention or inpatient treatment. Verbal and written discharge instructions were provided.  Patient was encouraged to return for any worsening symptoms, persisting symptoms, or any other concerns. Patient gives verbal understanding of provisional nature of diagnosis, plan, and follow-up.     Patient follow up advised: Patient was advised to follow-up as directed in discharge instructions.    [employer] engineer was used for part/all of this encounter. Errors in grammar were changed and fixed to the best of my ability.       Neuro MDM:   Justification for/against neuroimaging: Normal neuro exam, dizziness resolved without intervention.                Eldridge Dace, PA             Eldridge Dace, [place]  12/09/21 [phone]

## 2021-12-09 NOTE — ED Triage Notes (Signed)
Pt presents mid-sternal chest pain, neck and shoulder soreness, joint aching, intermittent dizziness starting when he got to work this morning. +DOE.      Prehospital medications given: No

## 2021-12-12 LAB — EKG 12-LEAD
P: 60 deg
PR: 187 ms
QRS: 28 deg
QRSD: 87 ms
QT: 337 ms
QTc: 394 ms
Rate: 82 {beats}/min
T: 2 deg

## 2021-12-13 ENCOUNTER — Telehealth: Payer: Self-pay | Admitting: Primary Care

## 2021-12-13 NOTE — Telephone Encounter (Signed)
Writer called to schedule an ED fuv.  Spoke to pt's wife.  She stated he was at work.  Writer scheduled a fuv and told pt's wife to have him check his mychart.  She was appreciative

## 2021-12-15 NOTE — Progress Notes (Signed)
Subjective:    Chief Complaint   Patient presents with   . Follow-up     ED FUV Chest Pain       HPI   Hiren Vitucci is a 61 y.o. male with a PMH significant for high cholesterol, asthma, vitamin d deficiency, allergic rhinitis, CKD stage 2, who presents to the office today for ED follow up.    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 He is going to start taking it tomorrow to see if it causes his muscle pain    Today he is feeling great  No shortness of breath or fevers    Medications and problem list reviewed during visit.     Patient Active Problem List   Diagnosis Code   . Allergic rhinitis J30.9   . Asthma J45.909   . High cholesterol E78.00   . Vitamin D deficiency E55.9   . Health care maintenance Z00.00   . Colonic polyp tubular adenoma next scope due 2022 November  K63.5   . Obesity, unspecified E66.9   . CKD (chronic kidney disease) stage 2, GFR 60-89 ml/min N18.2       Current Outpatient Medications on File Prior to Visit   Medication Sig Dispense Refill   . PULMICORT FLEXHALER 180 MCG/ACT flexhaler INHALE 1 PUFF INTO THE LUNGS TWICE DAILY 1 each 3   . mometasone (NASONEX) 50 MCG/ACT nasal spray SHAKE LIQUID AND USE 2 SPRAYS IN EACH NOSTRIL DAILY 51 g 1   . atorvastatin (LIPITOR) 40 mg tablet TAKE 1 TABLET(40 MG) BY MOUTH DAILY 90 tablet 3   . cholecalciferol (VITAMIN D) 1,000 unit capsule Take 1 capsule (1,000 units total) by mouth daily 100 capsule 5   . albuterol HFA (PROVENTIL, VENTOLIN, PROAIR HFA) 108 (90 Base) MCG/ACT inhaler Inhale 1-2 puffs into the lungs every  4 hours as needed for Wheezing Shake well before each use. 1 each 3   . montelukast (SINGULAIR) 10 MG tablet Take 1 tablet (10 mg total) by mouth nightly as needed (asthma and allergies) 30 tablet 5   . cetirizine (ZYRTEC) 10 MG tablet Take 1 tablet (10 mg total) by mouth daily       No current facility-administered medications on file prior to visit.       Allergies   Allergen Reactions   . Moxifloxacin Other (See Comments)     Penile glans swelling       Review of Systems    Review of Systems   Constitutional: Negative for chills and fever.   Respiratory: Negative for cough and shortness of breath.    Cardiovascular: Negative for chest pain.   Gastrointestinal: Negative for nausea and vomiting.   Neurological: Negative for dizziness and headaches.         Objective:    Vitals:    12/16/21 0816   BP: 121/76   Pulse: 101   Weight: 85.3 kg (188 lb)   Height: 1.549 m (5\' 1" )  Physical Exam    Physical Exam  Constitutional:       Appearance: Normal appearance.   Cardiovascular:      Rate and Rhythm: Normal rate and regular rhythm.      Pulses: Normal pulses.      Heart sounds: Normal heart sounds.   Pulmonary:      Effort: Pulmonary effort is normal.   Neurological:      Mental Status: He is alert and oriented to person, place, and time.   Psychiatric:         Mood and Affect: Mood normal.         Behavior: Behavior normal.          Assessment and Plan    Neko Komisar is a 61 y.o. male with a PMH significant for high cholesterol, asthma, vitamin d deficiency, allergic rhinitis, CKD stage 2, who presents to the office today for ED follow up.    Chest, shoulder, and neck pain- work-up in the ED did not show any cardiac etiology for his symptoms.  Consider stress related versus atorvastatin side effect.  When asked about addressing stress with medication and/or therapy, he declines.  Check CK level now.  He is going to send an update on how he is feeling next week after restarting his atorvastatin.  May need to  adjust how he takes it if unable to tolerate muscle aches    Orders and Meds:    Orders Placed This Encounter   Procedures   . CK     Standing Status:   Future     Standing Expiration Date:   01/15/2023       New Prescriptions    No medications on file       Nance Pear PA-C

## 2021-12-16 ENCOUNTER — Ambulatory Visit: Payer: Commercial Managed Care - PPO | Admitting: Medical

## 2021-12-16 ENCOUNTER — Other Ambulatory Visit: Payer: Self-pay

## 2021-12-16 VITALS — BP 121/76 | HR 101 | Ht 61.0 in | Wt 188.0 lb

## 2021-12-16 DIAGNOSIS — R079 Chest pain, unspecified: Secondary | ICD-10-CM

## 2021-12-16 DIAGNOSIS — M791 Myalgia, unspecified site: Secondary | ICD-10-CM

## 2021-12-16 NOTE — Patient Instructions (Addendum)
Plan  -please send message next week  -blood work

## 2021-12-24 ENCOUNTER — Other Ambulatory Visit
Admission: RE | Admit: 2021-12-24 | Discharge: 2021-12-24 | Disposition: A | Discharge status: Home / Self Care | Payer: Commercial Managed Care - PPO | Source: Ambulatory Visit | Attending: Medical | Admitting: Medical

## 2021-12-24 DIAGNOSIS — M791 Myalgia, unspecified site: Secondary | ICD-10-CM | POA: Insufficient documentation

## 2021-12-24 LAB — CK: CK: 140 U/L (ref 39–308)

## 2021-12-27 ENCOUNTER — Encounter: Payer: Self-pay | Admitting: Medical

## 2022-04-21 ENCOUNTER — Other Ambulatory Visit: Payer: Self-pay | Admitting: Primary Care

## 2022-04-21 DIAGNOSIS — J3089 Other allergic rhinitis: Secondary | ICD-10-CM

## 2022-04-21 NOTE — Telephone Encounter (Signed)
Refill request    Medication:     Relevant labs:     Confirm that the patient wants the medication sent to:   [id number] [place], [place] - [address] AT [facility] [address]  [address] [place] [address]  Phone: [phone] Fax: [phone]        Last visit: 07/21/2021   Next visit: Future Encounters      10/20/2022  9:00 AM Sch    PHYSICAL    RIM    Melbourne Abts, MD                **If the refill protocol indicates they are due for labs please order them and tell the patient to have them drawn**

## 2022-06-02 ENCOUNTER — Other Ambulatory Visit: Payer: Self-pay | Admitting: Primary Care

## 2022-06-02 DIAGNOSIS — E7800 Pure hypercholesterolemia, unspecified: Secondary | ICD-10-CM

## 2022-06-02 NOTE — Telephone Encounter (Signed)
Refill request    Medication: atorvastatin    Relevant labs: Statin (lipid panel)       Lab results: 07/02/21  0829   Cholesterol 130   HDL 51   LDL Calculated 67   Triglycerides 59   Chol/HDL Ratio 2.5     No components found with this basename: NHLDC      Confirm that the patient wants the medication sent to:   [facility] #[id number] [place], [place] - [address] AT [facility] [address]  [address]  [place] [address]  Phone: [phone] Fax: [phone]        Last visit: 07/21/2021   Next visit: Future Encounters      10/20/2022  9:00 AM Sch    PHYSICAL    RIM    Melbourne Abts, MD                **If the refill protocol indicates they are due for labs please order them and tell the patient to have them drawn**

## 2022-06-12 ENCOUNTER — Other Ambulatory Visit: Payer: Self-pay | Admitting: Primary Care

## 2022-06-13 NOTE — Telephone Encounter (Signed)
Refill request    Medication:   Requested Prescriptions     Pending Prescriptions Disp Refills    PULMICORT FLEXHALER 180 MCG/ACT flexhaler [Pharmacy Med Name: PULMICORT FLEXHALR(120PUFFS)] 1 each 3     Sig: INHALE 1 PUFF INTO THE LUNGS TWICE DAILY       Confirm that the patient wants the medication sent to:   [facility] #[id number] [place], [place] - [address] AT [facility] OF [address]  [address]  [place] [address]  Phone: [phone] Fax: [phone]        Last visit: 07/21/2021   Next visit: Future Encounters      10/20/2022  9:00 AM Sch    PHYSICAL    RIM    Melbourne Abts, MD                **If the refill protocol indicates they are due for labs please order them and tell the patient to have them drawn**

## 2022-09-18 ENCOUNTER — Telehealth: Payer: Self-pay | Admitting: Primary Care

## 2022-09-18 NOTE — Telephone Encounter (Signed)
Called patient he feels pretty good.   We discussed current status of outpatient COVID treatment options including my understanding of indications for use in light of current supply and Mr. Verville health and current status.   As of September 2023 treatment is fairly widely available for anyone with mild-moderate symptoms for <5 days who are at  high risk of progression to severe disease.   Paxlovid being preferred for the patients at highest risk of progression to severe disease but with about a 40% risk of diarrhea, a 5-6% risk of rebound COVID with recurrent need to quarantine and significant drug interaction risk.         We discussed use of anti virals and option to use https://covid-19-therapeutics-locator-dhhs.hub.arcgis.com/ to search for locations with it in stock  and if using paxlovid to use the drug interaction checker  https://www.covid19-druginteractions.org/checker      Med list checked note of atorvastatin as potential interaction  He declines treatment  asked to let me know if either of them is doing worse

## 2022-09-18 NOTE — Telephone Encounter (Signed)
Positive COVID test      1. Significant trouble breathing (i.e. can not talk in full sentences)?   no      Additional information:  On what date did your symptoms start?Saturday morning    On what date did you test positive? today    Wheezing?  no  Temperature greater than 100.4 F?  no  Nasal congestion?   yes  Sore throat?  no  Nausea?  no  Vomiting?  no  Diarrhea?  no  Headches?  no  Body aches?  no  Chills?  no  Is the patient requesting an antiviral medication?  no      Patient reports watery eyes, he has been taking mucinex and robitussin for last 2 days. He is aware that he needs to quarantine

## 2022-10-16 ENCOUNTER — Telehealth: Payer: Self-pay

## 2022-10-16 NOTE — Telephone Encounter (Signed)
Copied from [place] [id number]. Topic: Appointments - Schedule Appointment  >> Oct 16, 2022 10:09 AM Carl Olson B wrote:  The patient is calling to schedule his repeat colonoscopy, per Dr. Crist Infante and the referral from 07/21/21.     Please callback patient to discuss/schedule. He can be reached at: [phone]

## 2022-10-16 NOTE — Telephone Encounter (Addendum)
Error

## 2022-10-18 NOTE — Telephone Encounter (Signed)
Left message for patient to call our office to schedule 3 YR COD, next available with Dr Crist Infante or General GI MD Mod Sedation Any Location.

## 2022-10-20 ENCOUNTER — Other Ambulatory Visit: Payer: Self-pay

## 2022-10-20 ENCOUNTER — Encounter: Payer: Self-pay | Admitting: Gastroenterology

## 2022-10-20 ENCOUNTER — Other Ambulatory Visit
Admission: RE | Admit: 2022-10-20 | Discharge: 2022-10-20 | Disposition: A | Discharge status: Home / Self Care | Payer: Commercial Managed Care - PPO | Source: Ambulatory Visit | Attending: Primary Care | Admitting: Primary Care

## 2022-10-20 ENCOUNTER — Encounter: Payer: Self-pay | Admitting: Primary Care

## 2022-10-20 ENCOUNTER — Ambulatory Visit: Payer: Commercial Managed Care - PPO | Admitting: Primary Care

## 2022-10-20 VITALS — BP 124/83 | HR 84 | Ht 60.98 in | Wt 191.8 lb

## 2022-10-20 DIAGNOSIS — N182 Chronic kidney disease, stage 2 (mild): Secondary | ICD-10-CM

## 2022-10-20 DIAGNOSIS — E7800 Pure hypercholesterolemia, unspecified: Secondary | ICD-10-CM

## 2022-10-20 DIAGNOSIS — Z23 Encounter for immunization: Secondary | ICD-10-CM

## 2022-10-20 DIAGNOSIS — E559 Vitamin D deficiency, unspecified: Secondary | ICD-10-CM

## 2022-10-20 DIAGNOSIS — Z6836 Body mass index (BMI) 36.0-36.9, adult: Secondary | ICD-10-CM

## 2022-10-20 DIAGNOSIS — Z0000 Encounter for general adult medical examination without abnormal findings: Secondary | ICD-10-CM

## 2022-10-20 DIAGNOSIS — J4540 Moderate persistent asthma, uncomplicated: Secondary | ICD-10-CM

## 2022-10-20 DIAGNOSIS — D126 Benign neoplasm of colon, unspecified: Secondary | ICD-10-CM

## 2022-10-20 LAB — BASIC METABOLIC PANEL
Anion Gap: 10 (ref 7–16)
CO2: 28 mmol/L (ref 20–28)
Calcium: 9.8 mg/dL (ref 8.6–10.2)
Chloride: 103 mmol/L (ref 96–108)
Creatinine: 1.17 mg/dL (ref 0.67–1.17)
Glucose: 87 mg/dL (ref 60–99)
Lab: 9 mg/dL (ref 6–20)
Potassium: 4.5 mmol/L (ref 3.3–5.1)
Sodium: 141 mmol/L (ref 133–145)
eGFR BY CREAT: 71 *

## 2022-10-20 LAB — LIPID PANEL
Chol/HDL Ratio: 2.7
Cholesterol: 153 mg/dL
HDL: 56 mg/dL (ref 40–60)
LDL Calculated: 86 mg/dL
Non HDL Cholesterol: 97 mg/dL
Triglycerides: 53 mg/dL

## 2022-10-20 LAB — PSA (EFF.4-2010): PSA (eff. 4-2010): 1.89 ng/mL (ref 0.00–4.00)

## 2022-10-20 LAB — HEMOGLOBIN A1C: Hemoglobin A1C: 5.7 % — ABNORMAL HIGH

## 2022-10-20 LAB — VITAMIN D: 25-OH Vit Total: 33 ng/mL (ref 30–60)

## 2022-10-20 NOTE — Progress Notes (Signed)
Patient ID: Carl Olson is a 62 y.o. year old male who presents today forAnnual Exam    BP 124/83 (BP Location: Left arm, Patient Position: Sitting, Cuff Size: adult)   Pulse 84   Ht 1.549 m (5' 0.98")   Wt 87 kg (191 lb 12.8 oz)   BMI 36.26 kg/m?   Estimated body mass index is 36.26 kg/m? as calculated from the following:    Height as of this encounter: 1.549 m (5' 0.98").    Weight as of this encounter: 87 kg (191 lb 12.8 oz).      Subjective            62 year old with history of high cholesterol,  asthma, reflux, allergic rhinitis     Patient presents for preventive health care visit.    Asthma doing well at this time      Had chest pain ER visit last spring he thinks [facility]     He is doing more paperwork in his job now then in the past.       Had COVID last month doing well now declined treatment      Medications, allergies, past medical history, social history reviewed and updated in electronic record and full ROS reviewed with patient Review of Systems -   Review of Systems   Constitutional: Negative.    HENT: Negative.     Eyes: Negative.    Respiratory: Negative.     Cardiovascular: Negative.  Negative for chest pain and palpitations.   Gastrointestinal: Negative.  Negative for blood in stool and constipation.   Genitourinary: Negative.    Musculoskeletal: Negative.    Skin: Negative.    Neurological:  Negative for dizziness and headaches.   Endo/Heme/Allergies: Negative.    Psychiatric/Behavioral: Negative.  Negative for depression, memory loss and suicidal ideas.         Allergy / Social History / Medications:     Allergies   Allergen Reactions    Moxifloxacin Other (See Comments)     Penile glans swelling       Social History     Social History Narrative    Married since 1988, two step daughters.  7 grand stepchildren.  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         Social History     Tobacco Use    Smoking status: Never    Smokeless tobacco: Never   Substance Use Topics    Alcohol use: Yes     Comment: 1 beer every few months              Objective      Vitals:    10/20/22 0913   BP: 124/83   Pulse: 84   Weight: 87 kg (191 lb 12.8 oz)   Height: 1.549 m (5' 0.98")       General: Well-developed/well-nourished  .  No acute distress.  Appears stated age.  Skin: No rash.  No atypical nevi.    Lymph nodes: No cervical, submandibular, supraclavicular, axillary, inguinal lymphadenopathy.  HEENT: Pupils symmetric.  Conjunctiva pink.  Sclera anicteric.  Turbinates without edema or erythema.  Oropharynx moist with no lesions.  TMs are shiny .  Neck: No thyromegaly or thyroid nodules. No other masses, no crepitus. No carotid bruits.  No jugular venous distention.  Chest/Breast:  Recumbent exams.  No mass or tenderness.  Skin without dimpling or retraction.  No nipple discharge.  Lungs: Clear to auscultation bilaterally, normal respiratory effort, no wheeze or rales   Cardiac:  Regular S1 and S2 with no murmur, rub, gallop.  PMI nondisplaced.  Abdomen: Soft, non tender, non distended.  No palpable HSM or mass.  Normal active bowel sounds.  Genitals: Testes descended bilaterally, no palpable mass or tenderness.  Penis without lesions or discharge.   Rectal/prostate: deferred   Spine/musculoskeletal: Spine is non tender.  No joint deformities .  Good functional range of motion  Extremities: No clubbing, cyanosis, edema.  Vascular: 2+ radial, brachial, femoral, dorsalis pedis pulses bilaterally.  Neurologic: Patient is alert and oriented.  Sensation intact to light touch  Gait is without ataxia.      Psych:  Appropriate historian  , normal affect and interaction, good eye contact.         Recent Labs and Other Results     Lab Results   Component Value Date    NA 141 12/09/2021    K 4.4 12/09/2021    CL 107 12/09/2021    CO2 26 12/09/2021    UN 11 12/09/2021    CREAT 1.08  12/09/2021    VID25 48 06/05/2020    WBC 7.3 12/09/2021    HGB 14.7 12/09/2021    HCT 45 12/09/2021    PLT 210 12/09/2021    TSH 1.44 08/26/2018    CHOL 130 07/02/2021    TRIG 59 07/02/2021    HDL 51 07/02/2021    LDLC 67 07/02/2021    CHHDC 2.5 07/02/2021     Hemoglobin A1C   Date Value Ref Range Status   12/28/2016 6.0 4.0 - 6.0 % Final     Comment:                                   NON-DIABETIC         EKG sinus axis positive rate 71 borderline prolonged PR 205       Assessment & Plan      1. Health care maintenance  Vitamin D    Basic metabolic panel    Lipid Panel (Reflex to Direct  LDL if Triglycerides more than 400)    PSA (eff.11-2008)    Hemoglobin A1c    CANCELED: Basic metabolic panel    CANCELED: Lipid Panel (Reflex to Direct  LDL if Triglycerides more than 400)    CANCELED: PSA (eff.11-2008)    CANCELED: Hemoglobin A1c      2. Moderate persistent asthma without complication        3. High cholesterol  Lipid Panel (Reflex to Direct  LDL if Triglycerides more than 400)    CANCELED: Lipid Panel (Reflex to Direct  LDL if Triglycerides more than 400)      4. CKD (chronic kidney disease) stage 2, GFR 60-89 ml/min  Basic metabolic panel    CANCELED: Basic metabolic panel      5. Class 2 severe obesity due to excess calories with serious comorbidity and body mass index (BMI) of 36.0 to 36.9 in adult        6. Adenomatous polyp of colon, unspecified part of colon  AMB REFERRAL TO [facility]      7. Vitamin D deficiency        8. Need for vaccination  Flu Quadrivalent <65  YO (normal dose)            Health Maintenance  Preventive health guidelines reviewed with patient and highlighted particular issues  pertinent to their health, gender, and age.    Discussed preventive lab testing and immunizations. See discussion below and orders for particular items discussed at today's visit.      Items discussed include (but are not limited only to) those indicated below     Health care proxy / advance  directives    reviewed on file   Lifestyle modification including diet and exercise - encouraged regular exercise, and weight loss.    Skin CA awareness/prevention discussed  --Sunscreen use / long sleeved clothing  --Monitor moles for the following changes:    A: asymmetry   B: border changes   C: color changes    D: diameter greater than a pencil eraser size    E: evolving, or changing mole       Seat belt use counseled     Tick Safety      Dentistry yearly advised    Eye Care every 1-2 years advised        Reviewed blood work on file / discussed additional blood tests     Hepatitis C screening for adults 18-79     EKG for those with HTN/significant CVD risk factors or age > 29     Colon CA screening starting at 50     overdue he is scheduling with [facility]         Prostate cancer screening  will do labs today no rectal exam     Testicular self exam discussed         Vaccinations:  Immunization History   Administered Date(s) Administered    Covid-19 mRNA vaccine (PFIZER) IM 30 mcg/0.43mL 10/31/2019, 11/23/2019    Influenza Adult(50yr and up) 06/05/2011    Influenza Quad 0.54mL prefilled syringe/single dose vial (FluLaval,Fluzone,Afluria,Fluarix) 06/28/2012, 07/20/2015, 07/13/2016, 06/29/2017, 06/30/2018, 05/15/2019, 06/14/2020, 05/05/2021, 10/20/2022    Influenza Whole 07/08/2007, 05/04/2008, 05/06/2009    Influenza multi-dose vial 06/12/2013    MMR 06/14/2020    Pneumococcal Polysaccharide (Pneumovax) 06/29/2017    Td 07/05/2018    Tdap 03/02/2008     COVID Vaccination     Yearly flu shot advised       Shingrix vaccination discussed and advised for patients 50 and older asked to check to see if insurance covers shingles vaccine.     Pneumococcal vaccination discussed in light of 2022 updated CDC guidelines  check coverage       MMR had booster     Lab Results   Component Value Date    MEA NEGATIVE 06/05/2020        CV Risk stratification  Reviewed 2018 AHA cholesterol management guidelines with patient and discussed on  my review how I think they apply to this patients care.   Lab Results   Component Value Date    CHOL 130 07/02/2021    HDL 51 07/02/2021    LDLC 67 07/02/2021      Risk calculation tool done and discussed .

## 2022-10-20 NOTE — Patient Instructions (Addendum)
I put in blood work orders for next year.  Please note the blood work orders will expire in about 13 months and it's probable the physical will be more then 13 months from now.    Do sometime between 12 and 13 months from now or call me/My Chart  me to renew if you are going after that time frame        Check with your insurance company about coverage for the new pneumonia vaccine "Prevnar 20" or PCV 20.    This is recommended for adults older than 27 and for many younger adults with certain health conditions.  However   not all insurances are covering it.      In some cases they may pay for you to get it at the pharmacy instead of at our office.   I think it's definitely worth  doing if covered but if not covered it can be very expensive.   Let me know if it's covered and we can arrange for you to do it at a future visit or send a prescription to your pharmacy.       We discussed the option of having you get the    shingles vaccine Shingrix.     This is a two shot series.    This vaccine  replaces the old shingles vaccine.   As we discussed in the world of COVID there are some important pros and cons to consider on when you would want to get this vaccine as the post vaccine side effects may be mistaken for COVID.      As we discussed this vaccine does have some significant short lived side effects for some people who receive this.  Some people who received this vaccine felt tired, had muscle pain, a headache, shivering, fever, stomach pain, or nausea. Symptoms went away on their own in about 2 to 3 days.   About 1 out of 6 people who got Shingrix experienced side effects that prevented them from doing regular activities.  Side effects are more common in younger people.       If you decide you want to get it you will need to check with your insurance company about coverage for Shingrix when you call them confirm WHERE they cover it not just if they cover it.  Some insurances pay for it here sometimes some only pay  for you to get it from the pharmacy.  Others do not cover it.    I'm happy to order it however it is cheapest for you to get.    If you have no or only partial coverage you may want to look at the prices at the [facility] department of health they may be cheaper for you.

## 2022-10-22 ENCOUNTER — Encounter: Payer: Self-pay | Admitting: Primary Care

## 2022-10-27 ENCOUNTER — Telehealth: Payer: Self-pay

## 2022-10-27 NOTE — Telephone Encounter (Signed)
Patient returning call to schedule colonoscopy. Can be reached at [phone]

## 2022-10-27 NOTE — Telephone Encounter (Signed)
Spoke with Carl Olson , patient, regarding scheduling of procedure, details listed below:    Are you on any blood thinners? no   If yes, who prescribes them and what is their phone number?  n/a  Do you have an automated defibrillator? NO    Do you have an LVAD? NO     Do you have a tracheostomy? NO    Do you use BIPAP or oxygen at home? NO    For scheduling safety precautions, is the patient's BMI under 45? Estimated body mass index is 36.26 kg/m? as calculated from the following:    Height as of 10/20/22: 154.9 cm (5' 0.98").    Weight as of 10/20/22: 87 kg (191 lb 12.8 oz).    YES        TO ENSURE APPROPRIATE CLEANSE PREPARATION FOR COLONOSCOPY SCHEDULING ONLY:    Are you diabetic? NO    Do you move your bowels daily or every other day? YES  - MIRAPREP WILL BE SENT FOR NON DIABETIC PATIENTS.    Have you been diagnosed with kidney disease? NO    FOR ENDOSCOPY (includes CEN) SCHEDULING ONLY  Have you ever had banding for esophageal varices? NOT APPLICABLE.    NEW** Please double check if the patient has VA benefits they are using for this exam:      No, patient reported, they do not have VA benefits      Patient was scheduled for the following:    Procedure Type: cod   Sedation Type: MOD SED  Procedure Location: [place]    Procedure Date: April 3rd   Arrival time: 10:45am  Dr.:  sprung     Patient requested prep instructions sent via: MAILED

## 2022-11-07 ENCOUNTER — Telehealth: Payer: Self-pay

## 2022-11-07 NOTE — Telephone Encounter (Signed)
No call.  Chart reviewed for exclusion criteria (AICD, LVAD, known varices/banding, BIPAP, BMI >45, O2 dependent, tracheotomy patient, Michiel Sites needs) to determine appropriate procedure location, appropriate prep, anti-coagulants, and level of sedation.    Miralax prep instructions drafted and sent to pt's my chart per prescreen.

## 2022-11-15 ENCOUNTER — Other Ambulatory Visit: Payer: Self-pay

## 2022-11-15 ENCOUNTER — Encounter: Payer: Self-pay | Admitting: Liver Pancreas GI Surgery

## 2022-11-15 ENCOUNTER — Ambulatory Visit
Admission: RE | Admit: 2022-11-15 | Discharge: 2022-11-15 | Disposition: A | Discharge status: Home / Self Care | Payer: Commercial Managed Care - PPO | Source: Ambulatory Visit | Attending: Liver Pancreas GI Surgery | Admitting: Liver Pancreas GI Surgery

## 2022-11-15 DIAGNOSIS — D123 Benign neoplasm of transverse colon: Secondary | ICD-10-CM | POA: Insufficient documentation

## 2022-11-15 DIAGNOSIS — Z86010 Personal history of colonic polyps: Secondary | ICD-10-CM | POA: Insufficient documentation

## 2022-11-15 DIAGNOSIS — Z1211 Encounter for screening for malignant neoplasm of colon: Secondary | ICD-10-CM

## 2022-11-15 DIAGNOSIS — K5730 Diverticulosis of large intestine without perforation or abscess without bleeding: Secondary | ICD-10-CM | POA: Insufficient documentation

## 2022-11-15 DIAGNOSIS — D122 Benign neoplasm of ascending colon: Secondary | ICD-10-CM | POA: Insufficient documentation

## 2022-11-15 DIAGNOSIS — K648 Other hemorrhoids: Secondary | ICD-10-CM | POA: Insufficient documentation

## 2022-11-15 LAB — HM COLONOSCOPY

## 2022-11-15 MED ORDER — MIDAZOLAM HCL 1 MG/ML IJ SOLN *I* WRAPPED
INTRAMUSCULAR | Status: AC
Start: 2022-11-15 — End: 2022-11-15
  Filled 2022-11-15: qty 10

## 2022-11-15 MED ORDER — FENTANYL CITRATE 50 MCG/ML IJ SOLN *WRAPPED*
INTRAMUSCULAR | Status: AC
Start: 2022-11-15 — End: 2022-11-15
  Filled 2022-11-15: qty 4

## 2022-11-15 MED ORDER — FENTANYL CITRATE 50 MCG/ML IJ SOLN *WRAPPED*
INTRAMUSCULAR | Status: AC | PRN
Start: 2022-11-15 — End: 2022-11-15
  Administered 2022-11-15: 25 ug via INTRAVENOUS
  Administered 2022-11-15: 50 ug via INTRAVENOUS
  Administered 2022-11-15: 25 ug via INTRAVENOUS

## 2022-11-15 MED ORDER — LACTATED RINGERS IV SOLN *I*
100.0000 mL/h | INTRAVENOUS | Status: DC
Start: 2022-11-15 — End: 2022-11-16
  Administered 2022-11-15: 100 mL/h via INTRAVENOUS

## 2022-11-15 MED ORDER — ONDANSETRON HCL 2 MG/ML IV SOLN *I*
4.0000 mg | Freq: Once | INTRAMUSCULAR | Status: AC | PRN
Start: 2022-11-15 — End: 2022-11-15

## 2022-11-15 MED ORDER — ONDANSETRON 4 MG PO TBDP *I*
4.0000 mg | ORAL_TABLET | Freq: Once | ORAL | Status: AC | PRN
Start: 2022-11-15 — End: 2022-11-15

## 2022-11-15 MED ORDER — MIDAZOLAM HCL 1 MG/ML IJ SOLN *I* WRAPPED
INTRAMUSCULAR | Status: AC | PRN
Start: 2022-11-15 — End: 2022-11-15
  Administered 2022-11-15 (×3): 2 mg via INTRAVENOUS

## 2022-11-15 NOTE — Discharge Instructions (Addendum)
Gastroenterology Unit  Discharge Instructions for Colonoscopy      11/15/2022    11:27 AM    Colonoscopy and Polyp(s) Removed    Do not drive, operate heavy machinery, drink alcoholic beverages, make important personal or business decisions, or sign legal documents until the next day.     Return to your usual diet  Return to taking your usual medications    Things you may expect:  A small amount of bright red blood in your stool  It may be a few days before you have a bowel movement  You may have cramping, bloating, and feelings of "gas". These feelings should go away as you pass gas. If you still feel uncomfortable, walking around will help to pass the gas.  You were given medication to help you relax during the test. You may feel "fuzzy" and drowsy. Go home and rest for at least 4-6 hours.    You should call your doctor for any of the following:  Bad stomach pain  Fever  Bright red bleeding or clots (This may happen up to 20 days after the test.)  Dizziness or weakness that gets worse or lasts up to 24 hours.  Pain or redness at the IV site    If you have a serious problem after hours, Call [phone] to reach the GI physician on call. If you are unable to reach your doctor, go to the [facility] Emergency Department.    Follow Up Care:  If biopsies were taken during your procedure, we will send you the pathology results within 7-10 days. If you do not receive your pathology results after 10 days please call [phone]  Report will be sent to your primary doctor.  Repeat exam in  3-5 year(s)    New Prescriptions    No medications on file

## 2022-11-15 NOTE — Procedures (Addendum)
Procedure Report  Colonoscopy Procedure Note   Date of Procedure: 11/15/2022   Referring Physician: Melbourne Abts, MD  Primary Physician: Melbourne Abts, MD        Attending Physician: Quitman Livings, MD  Fellow:   Suann Larry, MD  Indications:  Surveillance for history of colonic polyps  Is there a family history of colorectal or Lynch-related malignancy? No  Previous colonoscopy: Yes -- Date: 06/2018  Medications: Fentanyl 100 mcg IV and Midazolam 6 mg IV were administered incrementally over the course of the procedure to achieve an adequate level of conscious sedation.    Sedation Face Times  Start Time: 1102  End Time: 1127  Duration (minutes): 25 Minutes        I provided moderate sedation.  During this time I was face-to-face with the patient and supervising the RN; who monitored the patient's level of consciousness and physiological status.    Scopes: [id number]    Procedure Details: Full disclosures of risks were reviewed with patient as detailed on the consent form. The patient was placed in the left lateral decubitus position and monitored with continuous pulse oximetry, interval blood pressure monitoring and direct observations.   After anorectal examination was performed, the colonoscope was inserted into the rectum and advanced under direct vision to the terminal ileum. The procedure was considered not difficult.  During withdrawal examination, the final quality of the prep was [facility] Bowel Prep Scale:  Right Colon: Grade3- (entire mucosa of colon segment seen well, with no residual staining, small fragments of stool, or opaque liquid)  Transverse Colon: Grade 3- (entire mucosa of colon segment seen well, with no residual staining, small fragments of stool, or opaque liquid)  Left Colon: Grade 3- (entire mucosa of colon segment seen well, with no residual staining, small fragments of stool, or opaque liquid)  A careful inspection was made as the colonoscope was withdrawn. A retroflexed view of the  rectum was performed; findings and interventions are described below. The patient was recovered in the GI recovery area.     Scope Times:     Insertion Time: 1106  Cecum Time: 1110  Exit Time: 1127    Findings:  Normal perineum and anus on DRE.  Scope passed to cecum and into TI x 3cm. TI normal.  Full view of distended caput cecum including medial wall from valve to AO was obtained, and photodocumented.    Three sessile polyps ranging from 4-50mm in the ascending colon to hepatic flexure were removed with cold snare and retrieved.   One 2mm sessile polyp in the transverse colon was removed with jumbo forceps.   Few scattered diverticula in the transverse colon with scattered small diverticula in the left colon without luminal distortion.   No active inflammation of ileal or colonic mucosal surface. On rectal retroflexion, small internal hemorrhoids. Visualization completed with High Definition White Light, with on-demand application of Narrow Band Imaging, and augmented with an FDA approved AI program during endoscope withdrawal.       Intervention(s):     Three polyp(s) removed by cold snare biopsy  One polyp(s) removed by jumbo biopsy      Complication(s):    none    EBL: <5 ml    Impression(s):    Scattered diverticulosis in the left colon, few in the transverse colon.  Four sub-centimeter colon polyps removed as above  Small internal hemorrhoids  Otherwise normal exam    Recommendation(s):    Await pathology.  Repeat colonoscopy in  3-5 years.  Follow up with primary care physician.    Histopathologic Diagnosis: Adenomas x2 confirmed, repeat exam recommended in 5 years.  FINAL DIAGNOSIS:   A. Polyps, right colon, biopsy:    - Tubular adenoma.     B. Polyp, transverse colon, biopsy:    - Tubular adenoma.     Arletha Grippe, MD    GI ATTENDING    I was present for the entire viewing portion of the exam and participated as needed throughout the procedure, and I concur with the findings and intervention descriptions  as outlined in this edited note. I also ordered and supervised the moderate sedation.    Quitman Livings, MD

## 2022-11-15 NOTE — Preop H&P (Signed)
OUTPATIENT PRE-PROCEDURE H&P    Chief Complaint / Indications for Procedure: History of colon polyps    Past Medical History:     Past Medical History:   Diagnosis Date    Asthma     Colonic polyp tubular adenoma next scope due 2022 November  12/27/2016    2019 colonoscopy  FINAL DIAGNOSIS:  (A) Cecum, polyp, biopsy:       - Tubular adenoma.  (B) Colon, right, polyps, biopsy:       - Tubular adenomas.  (C) Colon, transverse, polyps, biopsy,       - Tubular adenomas.     5yr ago  Surgical Pathology  FINAL DIAGNOSIS: Colon, cecum, biopsy:  - Tubular adenoma.       Esophageal reflux 01/29/2006    High cholesterol 04/04/2007    Lower GI bleed 06/2018    After colonoscopy with polypectomy     Vitamin D deficiency 05/05/2008     Past Surgical History:   Procedure Laterality Date    ENDOSCOPY, COLON, WITH POLYPECTOMY [phone]  06/28/2018         LIPOMA RESECTION Left     left shoulder     Family History   Problem Relation Age of Onset    High Blood Pressure Mother     Diabetes Mother     Dementia Mother     No Known Problems Father     High Blood Pressure Sister     High Blood Pressure Sister     Diabetes Sister     High Blood Pressure Sister     High Blood Pressure Sister     High Blood Pressure Sister     Asthma Sister     Colon cancer Neg Hx     Colon polyps Neg Hx     Stomach cancer Neg Hx      Social History     Socioeconomic History    Marital status: Married   Tobacco Use    Smoking status: Never    Smokeless tobacco: Never   Substance and Sexual Activity    Alcohol use: Yes     Comment: 1 beer every few months     Drug use: No    Sexual activity: Yes     Partners: Female   Social History Narrative    Married since 1988, two step daughters.  7 grand stepchildren.  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         Allergies:    Allergies   Allergen Reactions     Moxifloxacin Other (See Comments)     Penile glans swelling       Medications:  (Not in a hospital admission)     Current Outpatient Medications   Medication    PULMICORT FLEXHALER 180 MCG/ACT flexhaler    atorvastatin (LIPITOR) 40 mg tablet    mometasone (NASONEX) 50 MCG/ACT nasal spray    cholecalciferol (VITAMIN D) 1,000 unit capsule    cetirizine (ZYRTEC) 10 MG tablet    albuterol HFA (PROVENTIL, VENTOLIN, PROAIR HFA) 108 (90 Base) MCG/ACT inhaler    montelukast (SINGULAIR) 10 MG tablet     Current Facility-Administered Medications   Medication Dose Route Frequency    Lactated Ringers Infusion  100 mL/hr Intravenous Continuous     Vitals:    11/15/22 1039   BP: Marland Kitchen)  151/92   Pulse: 97   Resp: 18   Temp: 36.4 C (97.5 F)   Weight: 83.5 kg (184 lb)   Height: 154.9 cm (5\' 1" )       ROS:  [id number]    Physical Examination:  Head/Nose/Throat:negative  Lungs:Lungs clear  Cardiovascular:normal S1 and S2  Abdomen: abdomen soft, non-tender, nondistended, normal active bowel sounds, no masses or organomegaly      Lab Results: none    Radiology impressions (last 30 days):  No results found.    Currently Active Problems:  Patient Active Problem List   Diagnosis Code    Allergic rhinitis J30.9    Asthma J45.909    High cholesterol E78.00    Vitamin D deficiency E55.9    Health care maintenance Z00.00    Colonic polyp tubular adenoma next scope due 2022 November  K63.5    Obesity, unspecified E66.9    CKD (chronic kidney disease) stage 2, GFR 60-89 ml/min N18.2        Impression:  Surveillance colonoscopy    Plan:  Colonoscopy  Moderate Sedation    UPDATES TO PATIENT'S CONDITION on the DAY OF SURGERY/PROCEDURE    I. Updates to Patient's Condition (to be completed by a provider privileged to complete a H&P, following reassessment of the patient by the provider):    Full H&P done today; no updates needed.    II. Procedure Readiness   I have reviewed the patient's H&P and updated condition. By completing and signing this form,  I attest that this patient is ready for surgery/procedure.      III. Attestation   I have reviewed the updated information regarding the patient's condition and it is appropriate to proceed with the planned surgery/procedure.    Arletha Grippe, MD as of 10:55 AM 11/15/2022

## 2022-11-16 ENCOUNTER — Telehealth: Payer: Self-pay

## 2022-11-16 NOTE — Telephone Encounter (Signed)
Post procedure phone call completed.

## 2022-11-17 ENCOUNTER — Telehealth: Payer: Self-pay

## 2022-11-17 NOTE — Telephone Encounter (Signed)
Post follow-up care completed. Pt has no concerns at this time.

## 2023-01-09 ENCOUNTER — Other Ambulatory Visit: Payer: Self-pay | Admitting: Primary Care

## 2023-01-09 DIAGNOSIS — J3089 Other allergic rhinitis: Secondary | ICD-10-CM

## 2023-01-09 NOTE — Telephone Encounter (Signed)
Refill request    Medication:   Requested Prescriptions     Pending Prescriptions Disp Refills    mometasone (NASONEX) 50 MCG/ACT nasal spray [Pharmacy Med Name: MOMETASONE NASAL SPRAY (120)] 51 g 1     Sig: SHAKE LIQUID AND USE 2 SPRAYS IN EACH NOSTRIL DAILY       Confirm that the patient wants the medication sent to:   [id number] [place], [place] - [address] AT [facility] [address]  [address]  [place] [address]  Phone: [phone] Fax: [phone]        Last visit: 10/20/2022   Next visit: Future Encounters      04/21/2024  9:00 AM Sch    PHYSICAL    [facility]    Melbourne Abts, MD                **If the refill protocol indicates they are due for labs please order them and tell the patient to have them drawn**

## 2023-01-15 ENCOUNTER — Ambulatory Visit
Admit: 2023-01-15 | Payer: PRIVATE HEALTH INSURANCE | Attending: Vascular and Interventional Radiology | Primary: Internal Medicine

## 2023-01-15 ENCOUNTER — Inpatient Hospital Stay: Admit: 2023-01-15 | Discharge: 2023-01-15 | Payer: PRIVATE HEALTH INSURANCE | Primary: Internal Medicine

## 2023-01-15 ENCOUNTER — Encounter
Admit: 2023-01-15 | Payer: PRIVATE HEALTH INSURANCE | Attending: Vascular and Interventional Radiology | Primary: Internal Medicine

## 2023-01-15 DIAGNOSIS — N529 Male erectile dysfunction, unspecified: Secondary | ICD-10-CM

## 2023-01-15 DIAGNOSIS — Z0000 Encounter for general adult medical examination without abnormal findings: Secondary | ICD-10-CM

## 2023-01-15 LAB — TSH: BKR THYROID STIMULATING HORMONE: 1.58 ÂµIU/mL — ABNORMAL HIGH

## 2023-01-15 LAB — LIPID PANEL
BKR CHOLESTEROL/HDL RATIO: 4.3 (ref 0.0–5.0)
BKR CHOLESTEROL: 201 mg/dL — ABNORMAL HIGH
BKR HDL CHOLESTEROL: 47 mg/dL (ref >=40)
BKR LDL CHOLESTEROL SAMPSON CALCULATED: 135 mg/dL — ABNORMAL HIGH
BKR TRIGLYCERIDES: 105 mg/dL

## 2023-01-15 LAB — CBC WITH AUTO DIFFERENTIAL
BKR WAM ABSOLUTE IMMATURE GRANULOCYTES.: 0.01 x 1000/ÂµL (ref 0.00–0.30)
BKR WAM ABSOLUTE LYMPHOCYTE COUNT.: 1.07 x 1000/ÂµL (ref 0.60–3.70)
BKR WAM ABSOLUTE NRBC (2 DEC): 0 x 1000/ÂµL (ref 0.00–1.00)
BKR WAM ANALYZER ANC: 2.71 x 1000/ÂµL (ref 2.00–7.60)
BKR WAM BASOPHIL ABSOLUTE COUNT.: 0.01 x 1000/ÂµL (ref 0.00–1.00)
BKR WAM BASOPHILS: 0.2 % (ref 0.0–1.4)
BKR WAM EOSINOPHIL ABSOLUTE COUNT.: 0.06 x 1000/ÂµL (ref 0.00–1.00)
BKR WAM EOSINOPHILS: 1.4 % (ref 0.0–5.0)
BKR WAM HEMATOCRIT (2 DEC): 44.5 % (ref 38.50–50.00)
BKR WAM HEMOGLOBIN: 14.8 g/dL — AB (ref 13.2–17.1)
BKR WAM IMMATURE GRANULOCYTES: 0.2 % (ref 0.0–1.0)
BKR WAM LYMPHOCYTES: 24.7 % (ref 17.0–50.0)
BKR WAM MCH (PG): 31.6 pg (ref 27.0–33.0)
BKR WAM MCHC: 33.3 g/dL (ref 31.0–36.0)
BKR WAM MCV: 94.9 fL (ref 80.0–100.0)
BKR WAM MONOCYTE ABSOLUTE COUNT.: 0.48 x 1000/ÂµL (ref 0.00–1.00)
BKR WAM MONOCYTES: 11.1 % (ref 4.0–12.0)
BKR WAM MPV: 10 fL (ref 8.0–12.0)
BKR WAM NEUTROPHILS: 62.4 % (ref 39.0–72.0)
BKR WAM NUCLEATED RED BLOOD CELLS: 0 % (ref 0.0–1.0)
BKR WAM PLATELETS: 322 x1000/ÂµL (ref 150–420)
BKR WAM RDW-CV: 14.1 % (ref 11.0–15.0)
BKR WAM RED BLOOD CELL COUNT.: 4.69 M/ÂµL (ref 4.00–6.00)
BKR WAM WHITE BLOOD CELL COUNT: 4.3 x1000/ÂµL (ref 4.0–11.0)

## 2023-01-15 LAB — COMPREHENSIVE METABOLIC PANEL
BKR A/G RATIO: 1.2 (ref 1.0–2.2)
BKR ALANINE AMINOTRANSFERASE (ALT): 39 U/L (ref 9–59)
BKR ALBUMIN: 4.2 g/dL (ref 3.6–5.1)
BKR ALKALINE PHOSPHATASE: 87 U/L (ref 9–122)
BKR ANION GAP: 15 (ref 7–17)
BKR ASPARTATE AMINOTRANSFERASE (AST): 33 U/L (ref 10–35)
BKR AST/ALT RATIO: 0.8
BKR BILIRUBIN TOTAL: 0.5 mg/dL (ref <=1.2)
BKR BLOOD UREA NITROGEN: 8 mg/dL (ref 6–20)
BKR BUN / CREAT RATIO: 8 (ref 8.0–23.0)
BKR CALCIUM: 9.2 mg/dL (ref 8.8–10.2)
BKR CHLORIDE: 98 mmol/L (ref 98–107)
BKR CO2: 25 mmol/L (ref 20–30)
BKR CREATININE: 1 mg/dL (ref 0.40–1.30)
BKR EGFR, CREATININE (CKD-EPI 2021): >60 mL/min/{1.73_m2} (ref >=60)
BKR GLOBULIN: 3.4 g/dL (ref 2.0–3.9)
BKR GLUCOSE: 99 mg/dL (ref 70–100)
BKR POTASSIUM: 3 mmol/L — ABNORMAL LOW (ref 3.3–5.3)
BKR PROTEIN TOTAL: 7.6 g/dL (ref 5.9–8.3)
BKR SODIUM: 138 mmol/L (ref 136–144)

## 2023-01-15 LAB — UA REFLEX CULTURE

## 2023-01-15 LAB — URINALYSIS WITH CULTURE REFLEX      (BH LMW YH)
BKR BILIRUBIN, UA: NEGATIVE
BKR BLOOD, UA: NEGATIVE fL (ref 8.0–12.0)
BKR GLUCOSE, UA: NEGATIVE
BKR KETONES, UA: NEGATIVE
BKR LEUKOCYTE ESTERASE, UA: NEGATIVE
BKR NITRITE, UA: NEGATIVE % (ref 4.0–12.0)
BKR PH, UA: 7 (ref 5.5–7.5)
BKR SPECIFIC GRAVITY, UA: 1.02 (ref 1.005–1.030)
BKR UROBILINOGEN, UA (MG/DL): <2 mg/dL (ref <=2.0)

## 2023-01-15 LAB — URINE MICROSCOPIC     (BH GH LMW YH)
BKR RBC/HPF INSTRUMENT: 1 /HPF (ref 0–2)
BKR URINE SQUAMOUS EPITHELIAL CELLS, UA (NUMERIC): 1 /HPF (ref 0–5)
BKR WBC/HPF INSTRUMENT: 1 /HPF (ref 0–5)

## 2023-01-15 LAB — VITAMIN B12: BKR VITAMIN B12: 493 pg/mL (ref 232–1245)

## 2023-01-15 LAB — VITAMIN D, 25-HYDROXY: BKR VITAMIN D 25-HYDROXY TOTAL: 36 ng/mL

## 2023-01-16 LAB — HEMOGLOBIN A1C
BKR ESTIMATED AVERAGE GLUCOSE: 137 mg/dL
BKR HEMOGLOBIN A1C: 6.4 % — ABNORMAL HIGH (ref 4.0–5.6)

## 2023-01-17 LAB — PSA, TOTAL (SCREENING) (BH GH L LMW YH): BKR PROSTATE SPECIFIC ANTIGEN, SCREENING: 1.147 ng/mL (ref 0.000–3.900)

## 2023-01-25 ENCOUNTER — Other Ambulatory Visit: Payer: Self-pay | Admitting: Primary Care

## 2023-01-26 MED ORDER — ALBUTEROL SULFATE HFA 108 (90 BASE) MCG/ACT IN AERS *I*
1.0000 | INHALATION_SPRAY | RESPIRATORY_TRACT | 3 refills | Status: DC | PRN
Start: 2023-01-26 — End: 2023-09-27

## 2023-01-26 NOTE — Telephone Encounter (Signed)
Refill request    Medication:   Requested Prescriptions     Pending Prescriptions Disp Refills    albuterol HFA (PROVENTIL, VENTOLIN) 108 (90 Base) MCG/ACT inhaler 1 each 3     Sig: Inhale 1-2 puffs into the lungs every 4 hours as needed for Wheezing. Shake well before each use.       Confirm that the patient wants the medication sent to:   [id number], [place] - [address] AT [employer] [address]  [address]  [place] [address]  Phone: [phone] Fax: [phone]        Last visit: 10/20/2022   Next visit: Future Encounters      04/21/2024  9:00 AM Sch    PHYSICAL    RIM    Melbourne Abts, MD                **If the refill protocol indicates they are due for labs please order them and tell the patient to have them drawn**

## 2023-06-11 ENCOUNTER — Encounter: Admit: 2023-06-11 | Payer: PRIVATE HEALTH INSURANCE | Primary: Internal Medicine

## 2023-06-18 ENCOUNTER — Ambulatory Visit: Admit: 2023-06-18 | Payer: PRIVATE HEALTH INSURANCE | Attending: Family | Primary: Internal Medicine

## 2023-06-18 DIAGNOSIS — R1319 Other dysphagia: Secondary | ICD-10-CM

## 2023-06-18 DIAGNOSIS — Z860100 Personal history of colon polyps, unspecified: Secondary | ICD-10-CM

## 2023-06-19 ENCOUNTER — Encounter: Admit: 2023-06-19 | Payer: PRIVATE HEALTH INSURANCE | Attending: Gastroenterology | Primary: Internal Medicine

## 2023-06-19 DIAGNOSIS — K219 Gastro-esophageal reflux disease without esophagitis: Secondary | ICD-10-CM

## 2023-06-19 DIAGNOSIS — R21 Rash and other nonspecific skin eruption: Secondary | ICD-10-CM

## 2023-06-19 DIAGNOSIS — K635 Polyp of colon: Secondary | ICD-10-CM

## 2023-06-19 DIAGNOSIS — K429 Umbilical hernia without obstruction or gangrene: Secondary | ICD-10-CM

## 2023-06-19 DIAGNOSIS — R1310 Dysphagia, unspecified: Secondary | ICD-10-CM

## 2023-06-19 DIAGNOSIS — K5790 Diverticulosis of intestine, part unspecified, without perforation or abscess without bleeding: Secondary | ICD-10-CM

## 2023-06-19 DIAGNOSIS — I10 Essential (primary) hypertension: Secondary | ICD-10-CM

## 2023-06-19 DIAGNOSIS — G4733 Obstructive sleep apnea (adult) (pediatric): Secondary | ICD-10-CM

## 2023-06-19 DIAGNOSIS — R011 Cardiac murmur, unspecified: Secondary | ICD-10-CM

## 2023-06-19 MED ORDER — SODIUM,POTASSIUM,MAG SULFATES 17.5 GRAM-3.13 GRAM-1.6 GRAM ORAL SOLN
Status: AC
Start: 2023-06-19 — End: ?

## 2023-06-19 MED ORDER — METOPROLOL SUCCINATE ER 25 MG TABLET,EXTENDED RELEASE 24 HR
25 | ORAL | Status: AC
Start: 2023-06-19 — End: ?

## 2023-06-19 MED ORDER — OMEPRAZOLE 20 MG CAPSULE,DELAYED RELEASE
20 | Freq: Every day | ORAL | Status: AC
Start: 2023-06-19 — End: ?

## 2023-06-19 NOTE — Telephone Encounter
 Patient is scheduled for an upper endoscopy & colonoscopy on 06/22/2023 at [facility]. Pre procedure call performed. Discharge transportation policy & NPO instructions given per pre procedure guidelines. Patient advised to follow bowel preparation instructions per Dr. Ardine Eng office. Patient advised to hold vitamins, herbal supplements & NSAIDs for one week prior to scheduled procedure. Patient instructed to take hydralazine & metoprolol with sip of water on the morning of scheduled procedure. Patient verbalizes understanding.

## 2023-06-21 NOTE — Other
 ReviewReview NP: Otilio Saber

## 2023-06-22 ENCOUNTER — Inpatient Hospital Stay
Admit: 2023-06-22 | Discharge: 2023-06-22 | Payer: PRIVATE HEALTH INSURANCE | Attending: Gastroenterology | Primary: Internal Medicine

## 2023-06-22 ENCOUNTER — Ambulatory Visit: Admit: 2023-06-22 | Payer: PRIVATE HEALTH INSURANCE | Attending: Family | Primary: Internal Medicine

## 2023-06-22 ENCOUNTER — Encounter: Admit: 2023-06-22 | Payer: PRIVATE HEALTH INSURANCE | Attending: Gastroenterology | Primary: Internal Medicine

## 2023-06-22 DIAGNOSIS — E669 Obesity, unspecified: Secondary | ICD-10-CM

## 2023-06-22 DIAGNOSIS — K635 Polyp of colon: Secondary | ICD-10-CM

## 2023-06-22 DIAGNOSIS — F17200 Nicotine dependence, unspecified, uncomplicated: Secondary | ICD-10-CM

## 2023-06-22 DIAGNOSIS — K2100 Gastro-esophageal reflux disease with esophagitis, without bleeding: Secondary | ICD-10-CM

## 2023-06-22 DIAGNOSIS — K429 Umbilical hernia without obstruction or gangrene: Secondary | ICD-10-CM

## 2023-06-22 DIAGNOSIS — K5730 Diverticulosis of large intestine without perforation or abscess without bleeding: Secondary | ICD-10-CM

## 2023-06-22 DIAGNOSIS — R1310 Dysphagia, unspecified: Secondary | ICD-10-CM

## 2023-06-22 DIAGNOSIS — Z79899 Other long term (current) drug therapy: Secondary | ICD-10-CM

## 2023-06-22 DIAGNOSIS — K648 Other hemorrhoids: Secondary | ICD-10-CM

## 2023-06-22 DIAGNOSIS — I10 Essential (primary) hypertension: Secondary | ICD-10-CM

## 2023-06-22 DIAGNOSIS — K219 Gastro-esophageal reflux disease without esophagitis: Secondary | ICD-10-CM

## 2023-06-22 DIAGNOSIS — R1319 Other dysphagia: Secondary | ICD-10-CM

## 2023-06-22 DIAGNOSIS — D123 Benign neoplasm of transverse colon: Secondary | ICD-10-CM

## 2023-06-22 DIAGNOSIS — G4733 Obstructive sleep apnea (adult) (pediatric): Secondary | ICD-10-CM

## 2023-06-22 DIAGNOSIS — R011 Cardiac murmur, unspecified: Secondary | ICD-10-CM

## 2023-06-22 DIAGNOSIS — R21 Rash and other nonspecific skin eruption: Secondary | ICD-10-CM

## 2023-06-22 DIAGNOSIS — Z860100 Personal history of colon polyps, unspecified: Secondary | ICD-10-CM

## 2023-06-22 DIAGNOSIS — K5790 Diverticulosis of intestine, part unspecified, without perforation or abscess without bleeding: Secondary | ICD-10-CM

## 2023-06-22 DIAGNOSIS — Z1211 Encounter for screening for malignant neoplasm of colon: Principal | ICD-10-CM

## 2023-06-22 DIAGNOSIS — Z6834 Body mass index (BMI) 34.0-34.9, adult: Secondary | ICD-10-CM

## 2023-06-22 MED ORDER — SODIUM CHLORIDE 0.9 % (FLUSH) INJECTION SYRINGE
0.9 % | Freq: Three times a day (TID) | INTRAVENOUS | Status: DC
Start: 2023-06-22 — End: 2023-06-22

## 2023-06-22 MED ORDER — GLYCOPYRROLATE 0.2 MG/ML INJECTION SOLUTION
0.2 | INTRAVENOUS | Status: DC | PRN
Start: 2023-06-22 — End: 2023-06-22
  Administered 2023-06-22: 16:00:00 0.2 mg/mL via INTRAVENOUS

## 2023-06-22 MED ORDER — LACTATED RINGERS INTRAVENOUS SOLUTION
INTRAVENOUS | Status: DC
Start: 2023-06-22 — End: 2023-06-22

## 2023-06-22 MED ORDER — PROPOFOL 10 MG/ML INTRAVENOUS EMULSION
10 | Status: DC | PRN
Start: 2023-06-22 — End: 2023-06-22
  Administered 2023-06-22: 16:00:00 10 mL/h

## 2023-06-22 MED ORDER — SODIUM CHLORIDE 0.9 % (FLUSH) INJECTION SYRINGE
0.9 % | INTRAVENOUS | Status: DC | PRN
Start: 2023-06-22 — End: 2023-06-22

## 2023-06-22 MED ORDER — FENTANYL (PF) 50 MCG/ML INJECTION SOLUTION
50 | INTRAVENOUS | Status: DC | PRN
Start: 2023-06-22 — End: 2023-06-22
  Administered 2023-06-22 (×2): 50 mcg/mL via INTRAVENOUS

## 2023-06-22 MED ORDER — PROPOFOL 10 MG/ML INTRAVENOUS EMULSION
10 | INTRAVENOUS | Status: DC | PRN
Start: 2023-06-22 — End: 2023-06-22
  Administered 2023-06-22 (×2): 10 mg/mL via INTRAVENOUS

## 2023-06-22 MED ORDER — MIDAZOLAM 1 MG/ML INJECTION SOLUTION
1 | Status: CP
Start: 2023-06-22 — End: ?

## 2023-06-22 MED ORDER — FENTANYL (PF) 50 MCG/ML INJECTION SOLUTION
50 | Status: CP
Start: 2023-06-22 — End: ?

## 2023-06-22 MED ORDER — MIDAZOLAM 1 MG/ML INJECTION SOLUTION
1 | INTRAVENOUS | Status: DC | PRN
Start: 2023-06-22 — End: 2023-06-22
  Administered 2023-06-22: 16:00:00 1 mg/mL via INTRAVENOUS

## 2023-06-22 MED ORDER — PHENYLEPHRINE 1 MG/10 ML (100 MCG/ML) IN 0.9 % SOD.CHLORIDE IV SYRINGE
1 | INTRAVENOUS | Status: DC | PRN
Start: 2023-06-22 — End: 2023-06-22
  Administered 2023-06-22 (×2): 1 mg/0 mL (00 mcg/mL) via INTRAVENOUS

## 2023-06-22 MED ORDER — LIDOCAINE (PF) 20 MG/ML (2 %) INJECTION SOLUTION
20 | INTRAVENOUS | Status: DC | PRN
Start: 2023-06-22 — End: 2023-06-22
  Administered 2023-06-22: 16:00:00 20 mg/mL (2 %) via INTRAVENOUS

## 2023-06-22 NOTE — Other
 Post Anesthesia Transfer of Care NotePatient: Martin Calhoun) Performed: Procedure(s) (LRB):UPPER GI ENDOSCOPY; DX, W/WO SPECIMEN COLLECTION, BRUSHING/WASHING (SEP PROC) with biopsies (N/A)COLONOSCOPY, FLEXIBLE; DX, W/WO SPECIMENS/COLON DECOMP (SEP PROC) wtih polypectomy (N/A)Last Vitals: I have reviewed the post-operative vital signs during the handoff as noted in the Epic chart.POSTOP HANDOFF :      Patient Location:  PACU     Level of Consciousness:  Awake, alert and oriented     VS stable since last recorded intra-op set? Yes       Oxygen source: room airPatient co-morbidities, intra-operative course, intake & output and antibiotics as per Anesthesia record were discussed with the RN.

## 2023-06-22 NOTE — Other
 Operative Diagnosis:Pre-op:   Gastroesophageal reflux disease, unspecified whether esophagitis present [K21.9]Esophageal dysphagia [R13.19]History of colonic polyps [Z86.0100] Patient Coded Diagnosis   Pre-op diagnosis: Gastroesophageal reflux disease, unspecified whether esophagitis present, Esophageal dysphagia, History of colonic polyps  Post-op diagnosis: Gastroesophageal reflux disease, unspecified whether esophagitis present, Esophageal dysphagia, History of colonic polyps  Patient Diagnosis   Pre-op diagnosis: Gastroesophageal reflux disease, unspecified whether esophagitis present [K21.9], Esophageal dysphagia [R13.19], History of colonic polyps [Z86.0100]  Post-op diagnosis: GERD, diverticulosis, erosive esophagitis, colon polyp, hemorroids    Post-op diagnosis:   * Gastroesophageal reflux disease, unspecified whether esophagitis present [K21.9]   * Esophageal dysphagia [R13.19]   * History of colonic polyps [Z86.0100]Operative Procedure(s) :Procedure(s) (LRB):UPPER GI ENDOSCOPY; DX, W/WO SPECIMEN COLLECTION, BRUSHING/WASHING (SEP PROC) with biopsies (N/A)COLONOSCOPY, FLEXIBLE; DX, W/WO SPECIMENS/COLON DECOMP (SEP PROC) wtih polypectomy (N/A)Post-op Procedure & Diagnosis ConfirmationPost-op Diagnosis: Post-op Diagnosis confirmed (no changes)Post-op Procedure: Post-op Procedure confirmed (no changes)Anesthesia ClarifiersGI/Endoscopy: Planned Screening Colonoscopy - Procedure Performed (see above)

## 2023-06-22 NOTE — Anesthesia Post-Procedure Evaluation
 Anesthesia Post-op NotePatient: Martin Calhoun):  Procedure(s) (LRB):UPPER GI ENDOSCOPY; DX, W/WO SPECIMEN COLLECTION, BRUSHING/WASHING (SEP PROC) with biopsies (N/A)COLONOSCOPY, FLEXIBLE; DX, W/WO SPECIMENS/COLON DECOMP (SEP PROC) wtih polypectomy (N/A) Last Vitals:  I have reviewed the post-operative vital signs as noted in the Epic chart.POSTOP EVALUATION:      Patient Recovery Location:  PACU     Vital Signs Status:  Stable     Patient Participation:  Patient participated     Mental Status:  Awake, alert and oriented     Respiratory Status:  Acceptable     Airway Patency:  Patent     Cardiovascular/Hydration Status:  Stable     Pain Management:  Satisfactory to patient     Nausea/Vomiting Status:  Satisfactory to patientNo notable events documented.

## 2023-06-22 NOTE — Anesthesia Pre-Procedure Evaluation
 This is a 62 y.o. male scheduled for UPPER GI ENDOSCOPY; DX, W/WO SPECIMEN COLLECTION, BRUSHING/WASHING (SEP PROC)COLONOSCOPY, FLEXIBLE; DX, W/WO SPECIMENS/COLON DECOMP (SEP PROC).Review of Systems/ Medical History Patient summary, EKG/Cardiac Studies , Labs, pre-procedure vitals, height, weight and NPO status reviewed.No previous anesthesia concernsAnesthesia Evaluation:   No history of anesthetic complications  Estimated body mass index.06/19/23 : 34.67 kg/m? Last patient weight recorded. 06/19/23 : 122.5 kg Last patient height recorded. 06/19/23 : 6' 2 (1.88 m) CC/HPI: 62 YO male with GERD, esophageal dysphagia and hx of colon polyps scheduled for EGD and colonoscopy  Past Surgical History:  COLONOSCOPY	TOTAL HIP ARTHROPLASTYCardiovascular: Patient has a history of: hypertension.   -Exercise tolerance: >4 METS -Other Cardiovascular:   Echo 2016Conclusion* Normal left ventricular size, thickness, systolic function, and wall motion. LVEF calculated by biplane Simpson's is 60%.* Mild diastolic dysfunction, consistent with relaxation abnormality.* Normal right ventricular cavity size.  There is normal right ventricular systolic function.* The left atrium is mildly dilated.  The LA volume index is 36 ml/m2.* There is mild aortic valve regurgitation.* IVC diameter < 2.1 cm that collapses > 50% with a sniff suggests normal RAP (0-5 mmHg, mean 3 mmHg).* No evidence of pericardial effusion.* Changes noted compared with prior study dated:  09/26/2011.  Left atrium is now enlarged.Marland Kitchen Respiratory:      -Obstructive sleep apnea:   yes     -Nicotine Dependence: yes, smoking Gastrointestinal/Genitourinary: -Gastrointestinal Disorders:  Patient has colon polyps. Patient has GERD. His GERD is well controlled.-Nutritional Disorders: Pt is obese per BMI definition-obesity (BMI > 30).Physical ExamCardiovascular:      Rhythm: regularPulmonary:    Exam deferredAirway:  Mallampati: IITM distance: >3 FBNeck ROM: fullMouth Opening: >3cmOther Findings: Front 2 teeth erodedAnesthesia History Rash Hypertension Murmur GERD (gastroesophageal reflux disease) Obstructive sleep apnea Colon polyps Umbilical hernia Diverticulosis Dysphagia  Surgical History COLONOSCOPY TOTAL HIP ARTHROPLASTY Substance History Smoking Status: Every Day Smokeless Tobacco Status: Never Alcohol use: Yes, unspecified volume Drug use: Not Currently Problem List Current as of 06/21/23 1451No problems recorded Lab Results Component Value Date  WBC 4.3 01/15/2023  HGB 14.8 01/15/2023  HCT 44.50 01/15/2023  PLT 322 01/15/2023 No results found for: LABPROT, PTT, INRLab Results Component Value Date  NA 138 01/15/2023  K 3.0 (L) 01/15/2023  CL 98 01/15/2023  CO2 25 01/15/2023  BUN 8 01/15/2023  CREATININE 1.00 01/15/2023  GLU 99 01/15/2023 Lab Results Component Value Date  CALCIUM 9.2 01/15/2023 Lab Results Component Value Date  ALKPHOS 87 01/15/2023  AST 33 01/15/2023  ALT 39 01/15/2023  ALBUMIN 4.2 01/15/2023  BILITOT 0.5 01/15/2023 No results found for: TROPONINT, TROPONINI, TROPTHS Anesthesia PlanASA 3 The primary anesthesia plan is  MAC. Anesthesia informed consent obtained. Consent obtained from: patientUse of blood products: consented  The post operative pain plan is IV analgesics.Plan discussed with CRNA.Anesthesiologist's Pre Op NoteI personally evaluated and examined the patient prior to the intra-operative phase of care on the day of the procedure.Marland Kitchen

## 2023-06-22 NOTE — H&P
 Gastroenterology Pre-Procedure Note Patient's Name: Martin Calhoun Date of Birth: 02/01/1961 Attending Endoscopist:   Dr. Gerrit Friends     Reason for Examination: Martin Calhoun is a 62 y.o. male who presents for EGD/Colonoscopy for evaluation or management of colorectal cancer screening, GERD, and dysphagia. 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  Known Drug Allergies or Side-effects: Hay fever and allergy relief, Unable to assess, and Avocado Physical Examination: Vital Signs: Height 6' 2 (1.88 m), weight 122.5 kg. General: The patient is in no acute distress. Head & Neck: Anicteric.  Normal dentition.  Neck is symmetric. Trachea in midline.  Lungs: Clear to auscultation. Heart: Normal sinus rhythm.   No murmurs, rubs or gallops. Abdomen: No organomegaly.  No masses.   Soft. Non-tenderness on palpation; no rebound or guarding. Extremities: No edema. Neurologic:   Oriented x3. Able to give informed consent, Assessment: Patient referred for EGD/Colonoscopy for the above indications; Discussed the procedure with the patient in detail. Discussed risks, benefits and alternatives and patient is willing to proceed. Recommendations: Proceed with planned endoscopy. Rosemary Holms, MD

## 2023-07-17 ENCOUNTER — Other Ambulatory Visit: Payer: Self-pay | Admitting: Primary Care

## 2023-07-17 DIAGNOSIS — E7800 Pure hypercholesterolemia, unspecified: Secondary | ICD-10-CM

## 2023-07-18 MED ORDER — ATORVASTATIN CALCIUM 40 MG PO TABS *I*: 40.0000 mg | ORAL_TABLET | Freq: Every day | ORAL | 3 refills | Status: DC

## 2023-09-19 ENCOUNTER — Other Ambulatory Visit: Payer: Self-pay | Admitting: Primary Care

## 2023-09-19 MED ORDER — PULMICORT FLEXHALER 180 MCG/ACT IN AEPB
1.0000 | INHALATION_SPRAY | Freq: Two times a day (BID) | RESPIRATORY_TRACT | 3 refills | Status: AC
Start: 2023-09-19 — End: ?

## 2023-09-19 NOTE — Telephone Encounter (Signed)
Refill request    Medication:   Requested Prescriptions     Pending Prescriptions Disp Refills    budesonide (PULMICORT FLEXHALER) 180 MCG/ACT flexhaler 1 each 3     Sig: Inhale 1 puff into the lungs 2 times daily.        Relevant labs:     Confirm that the patient wants the medication sent to:   [id number] [place], [place] - [address] AT [facility] OF [address]  [address]  [place] [address]  Phone: [phone] Fax: [phone]        Last Office Visit       Date Provider Department Visit Type Primary Dx    10/20/2022 Melbourne Abts, MD [facility] Office Visit Health care maintenance           Last PA Office Visit       Date Provider Department Visit Type Primary Dx    12/16/2021 Allena Katz, PA [facility] Office Visit Muscle ache           Last Telemedicine Visit       Date Provider Department Visit Type Primary Dx    11/25/2018 Melbourne Abts, MD [facility] Telemedicine Visit Cough                   Additional information to be completed by clinical staff:  If the request is for a controlled substance please paste the iStop results below:      **If the refill protocol indicates they are due for labs please order them and tell the patient to have them drawn**

## 2023-09-27 ENCOUNTER — Other Ambulatory Visit: Payer: Self-pay | Admitting: Primary Care

## 2023-09-27 DIAGNOSIS — J3089 Other allergic rhinitis: Secondary | ICD-10-CM

## 2023-09-27 MED ORDER — ALBUTEROL SULFATE HFA 108 (90 BASE) MCG/ACT IN AERS *I*
1.0000 | INHALATION_SPRAY | RESPIRATORY_TRACT | 3 refills | Status: AC | PRN
Start: 2023-09-27 — End: ?

## 2023-09-27 MED ORDER — MOMETASONE FUROATE 50 MCG/ACT NA SUSP *I*
2.0000 | Freq: Every day | NASAL | 1 refills | Status: DC
Start: 2023-09-27 — End: 2023-10-12

## 2023-09-27 NOTE — Telephone Encounter (Signed)
Refill request    Medication:   Requested Prescriptions     Pending Prescriptions Disp Refills    mometasone (NASONEX) 50 MCG/ACT nasal spray 51 g 1    albuterol HFA (PROVENTIL, VENTOLIN) 108 (90 Base) MCG/ACT inhaler 1 each 3     Sig: Inhale 1-2 puffs into the lungs every 4 hours as needed for Wheezing. Shake well before each use.        Relevant labs:     Confirm that the patient wants the medication sent to:   [facility] #[id number] [place], [place] - [address] AT [facility] [address]  [address]  [place] [address]  Phone: [phone] Fax: [phone]        Last Office Visit       Date Provider Department Visit Type Primary Dx    10/20/2022 Melbourne Abts, MD [facility] Office Visit Health care maintenance           Last PA Office Visit       Date Provider Department Visit Type Primary Dx    12/16/2021 Allena Katz, PA [facility] Office Visit Muscle ache           Last Telemedicine Visit       Date Provider Department Visit Type Primary Dx    11/25/2018 Melbourne Abts, MD [facility] Telemedicine Visit Cough                   Additional information to be completed by clinical staff:  If the request is for a controlled substance please paste the iStop results below:      **If the refill protocol indicates they are due for labs please order them and tell the patient to have them drawn**

## 2023-10-11 ENCOUNTER — Other Ambulatory Visit: Payer: Self-pay | Admitting: Primary Care

## 2023-10-11 DIAGNOSIS — J3089 Other allergic rhinitis: Secondary | ICD-10-CM

## 2023-10-12 NOTE — Telephone Encounter (Signed)
 Refill request    Medication:   Requested Prescriptions     Pending Prescriptions Disp Refills    mometasone (NASONEX) 50 MCG/ACT nasal spray [Pharmacy Med Name: MOMETASONE NASAL SPRAY (120)] 51 g 1     Sig: SHAKE LIQUID AND USE 2 SPRAYS IN EACH NOSTRIL DAILY        Relevant labs:     Confirm that the patient wants the medication sent to:   [id number] [place], [place] - [address] AT [facility] [address]  [address]  [place] [address]  Phone: [phone] Fax: [phone]        Last Office Visit       Date Provider Department Visit Type Primary Dx    10/20/2022 Melbourne Abts, MD [facility] Office Visit Health care maintenance           Last PA Office Visit       Date Provider Department Visit Type Primary Dx    12/16/2021 Allena Katz, PA [facility] Office Visit Muscle ache           Last Telemedicine Visit       Date Provider Department Visit Type Primary Dx    11/25/2018 Melbourne Abts, MD [facility] Telemedicine Visit Cough                   Additional information to be completed by clinical staff:  If the request is for a controlled substance please paste the iStop results below:      **If the refill protocol indicates they are due for labs please order them and tell the patient to have them drawn**

## 2024-04-21 ENCOUNTER — Encounter: Payer: Self-pay | Admitting: Gastroenterology

## 2024-04-21 ENCOUNTER — Encounter: Payer: Self-pay | Admitting: Primary Care

## 2024-04-21 ENCOUNTER — Other Ambulatory Visit: Payer: Self-pay

## 2024-04-21 ENCOUNTER — Ambulatory Visit: Payer: Commercial Managed Care - PPO | Admitting: Primary Care

## 2024-04-21 ENCOUNTER — Other Ambulatory Visit
Admission: RE | Admit: 2024-04-21 | Discharge: 2024-04-21 | Disposition: A | Discharge status: Home / Self Care | Source: Ambulatory Visit | Attending: Primary Care | Admitting: Primary Care

## 2024-04-21 VITALS — BP 123/80 | HR 82 | Ht 61.0 in | Wt 183.0 lb

## 2024-04-21 DIAGNOSIS — E7800 Pure hypercholesterolemia, unspecified: Secondary | ICD-10-CM | POA: Insufficient documentation

## 2024-04-21 DIAGNOSIS — D126 Benign neoplasm of colon, unspecified: Secondary | ICD-10-CM

## 2024-04-21 DIAGNOSIS — E559 Vitamin D deficiency, unspecified: Secondary | ICD-10-CM

## 2024-04-21 DIAGNOSIS — Z6379 Other stressful life events affecting family and household: Secondary | ICD-10-CM

## 2024-04-21 DIAGNOSIS — M79632 Pain in left forearm: Secondary | ICD-10-CM

## 2024-04-21 DIAGNOSIS — E6609 Other obesity due to excess calories: Secondary | ICD-10-CM

## 2024-04-21 DIAGNOSIS — J4540 Moderate persistent asthma, uncomplicated: Secondary | ICD-10-CM

## 2024-04-21 DIAGNOSIS — J3089 Other allergic rhinitis: Secondary | ICD-10-CM

## 2024-04-21 DIAGNOSIS — M25522 Pain in left elbow: Secondary | ICD-10-CM

## 2024-04-21 DIAGNOSIS — N182 Chronic kidney disease, stage 2 (mild): Secondary | ICD-10-CM

## 2024-04-21 DIAGNOSIS — Z0000 Encounter for general adult medical examination without abnormal findings: Secondary | ICD-10-CM | POA: Insufficient documentation

## 2024-04-21 DIAGNOSIS — Z6834 Body mass index (BMI) 34.0-34.9, adult: Secondary | ICD-10-CM

## 2024-04-21 DIAGNOSIS — E66811 Obesity, class 1: Secondary | ICD-10-CM

## 2024-04-21 DIAGNOSIS — Z23 Encounter for immunization: Secondary | ICD-10-CM

## 2024-04-21 LAB — BASIC METABOLIC PANEL
Anion Gap: 14 (ref 7–16)
CO2: 23 mmol/L (ref 20–28)
Calcium: 8.9 mg/dL (ref 8.6–10.2)
Chloride: 103 mmol/L (ref 96–108)
Creatinine: 1.11 mg/dL (ref 0.67–1.17)
Glucose: 83 mg/dL (ref 60–99)
Lab: 10 mg/dL (ref 6–20)
Potassium: 4.2 mmol/L (ref 3.3–5.1)
Sodium: 140 mmol/L (ref 133–145)
eGFR BY CREAT: 75

## 2024-04-21 LAB — HEMOGLOBIN A1C: Hemoglobin A1C: 5.9 % — ABNORMAL HIGH (ref <=5.6)

## 2024-04-21 LAB — LIPID PANEL
Chol/HDL Ratio: 2.5
Cholesterol: 139 mg/dL
HDL: 55 mg/dL (ref 40–60)
LDL Calculated: 72 mg/dL
Non HDL Cholesterol: 84 mg/dL
Triglycerides: 59 mg/dL

## 2024-04-21 LAB — PSA (EFF.4-2010): PSA (eff. 4-2010): 1.7 ng/mL (ref 0.00–4.00)

## 2024-04-21 LAB — VITAMIN D: 25-OH Vit Total: 30 ng/mL (ref 30–60)

## 2024-04-21 NOTE — Patient Instructions (Addendum)
 We discussed the option of having you get the    shingles vaccine Shingrix.     This is a two shot series.    This vaccine  replaces the old shingles vaccine .  As we discussed this vaccine does have some significant short lived side effects for some people who receive this.  Some people who received this vaccine felt tired, had muscle pain, a headache, shivering, fever, stomach pain, or nausea. Symptoms went away on their own in about 2 to 3 days.   About 1 out of 6 people who got Shingrix experienced side effects that prevented them from doing regular activities.  Side effects are more common in younger people. If you decide you want to get it you will need to check with your insurance company about coverage for Shingrix when you call them confirm WHERE they cover it not just if they cover it.  Some insurances pay for it here sometimes some only pay for you to get it from the pharmacy.  Others do not cover it.    I'm happy to order it however it is cheapest for you to get.    If you have no or only partial coverage you may want to look at the prices at the [facility] department of health they may be cheaper for you.   Also check  with your insurance company about coverage for the new pneumonia vaccine Prevnar 20 or PCV 20.    This is recommended for adults older than 50  and for many younger adults with certain health conditions.  However   not all insurances are covering it.      In some cases they may pay for you to get it at the pharmacy instead of at our office.   I think it's definitely worth  doing if covered but if not covered it can be very expensive.   Let me know if it's covered and we can arrange for you to do it at a future visit or send a prescription to your pharmacy.   Do fasting labs now and in 12 months I put in blood work orders for next year.Please note the blood work orders will expire in about 13 months and it's probable the physical will be more then 13 months from  now.  Do sometime between 12 and 13 months from now or call me/My Chart  me to renew if you are going after that time frame  If your elbow doesn't get better let me know and I'll have you see someone

## 2024-04-21 NOTE — Progress Notes (Unsigned)
 Patient ID: Carl Olson is a 63 y.o. year old male who presents today forAnnual ExamBP 123/80 (BP Location: Left arm, Patient Position: Sitting, Cuff Size: adult)   Pulse 82   Ht 1.549 m (5' 1)   Wt 83 kg (183 lb)   BMI 34.58 kg/m Estimated body mass index is 34.58 kg/m as calculated from the following:  Height as of this encounter: 1.549 m (5' 1).  Weight as of this encounter: 83 kg (183 lb).Wt Readings from Last 4 Encounters: 04/21/24 83 kg (183 lb) 11/15/22 83.5 kg (184 lb) 10/20/22 87 kg (191 lb 12.8 oz) 12/16/21 85.3 kg (188 lb)  Subjective  ***Carl Olson  gave permission for the assistance of DAX recording in generation of our visit documentation*** History of Present IllnessThe patient is a 63 year old male who presents for a physical.His last visit was 18 months ago. 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 He manages this by avoiding dust exposure as much as possible.He has been experiencing weight loss, which he attributes to increased water  intake and reduced food consumption. He also reports constant aches and pains, particularly in his shoulders, arms, and elbows. He is right-handed and notes that his left hand causes him more discomfort than his right. These symptoms have been present for about a month. He has been using Biofreeze patches for relief, which he finds helpful. He has not tried Voltaren.He is currently on atorvastatin  40 mg for his high cholesterol. 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 He has made some lifestyle changes, including drinking a lot of water .Occupation: Works in [employer]:  Reduced food consumptionSleep: Reports difficulty sleeping due to stress from caring for his wife with dementiaLiving Condition: Takes care of his wife who has dementiaLast visit march 2024  cholesterol asthma  vitamin  D deficiency and physical Had colonoscopy in April 2024  tubular adenoma Patient presents for preventive health care visit.Medications, allergies, past medical history, family history, social history reviewed and updated in electronic record and full ROS reviewed with patient Review of Systems - ***Review of Systems Constitutional: Negative.  HENT: Negative.   Eyes: Negative.  Respiratory: Negative.   Cardiovascular: Negative.  Gastrointestinal: Negative.  Genitourinary: Negative.  Musculoskeletal:  Positive for joint pain and myalgias. Negative for back pain, falls and neck pain. Skin:  Positive for rash (a few bumps on back rarely itchy). Negative for itching. Endo/Heme/Allergies: Negative.  Psychiatric/Behavioral: Negative.  Negative for memory loss.    Allergy / Social History / Medications: Allergies[1]Social History Social History Narrative  Married since 1988, two step daughters.  7 grand stepchildren.  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   Social History Tobacco Use  Smoking status: Never  Smokeless tobacco: Never Substance Use Topics  Alcohol use: Yes   Comment: 1 beer every few months  Objective  Vitals:  04/21/24 0909 BP: 123/80 Pulse: 82 Weight: 83 kg (183 lb) Height: 1.549 m (5' 1)  Back there are 2 raised bumps one is red each about 2 mm in the area he describes  as rash Physical ExamExtremities: Tenderness noted in bilateral elbows, specifically at the medial and lateral epicondyles. Tenderness also noted in the  brachioradialis.Musculoskeletal: Pain elicited during resisted wrist extension and flexion tests, indicating lateral and medial epicondylitis.Physical ExamRecent Labs and Other Results Lab Results Component Value Date  NA 141 10/20/2022  K 4.5 10/20/2022  CL 103 10/20/2022  CO2 28 10/20/2022  UN 9 10/20/2022  CREAT 1.17 10/20/2022  VID25 33 10/20/2022  WBC 7.3 12/09/2021  HGB 14.7 12/09/2021  HCT 45 12/09/2021  PLT 210 12/09/2021  TSH 1.44 08/26/2018  CHOL 153 10/20/2022  TRIG 53 10/20/2022  HDL 56 10/20/2022  LDLC 86 10/20/2022  CHHDC 2.7 10/20/2022 Hemoglobin A1C Date Value Ref Range Status 10/20/2022 5.7 (H) % Final   Comment:   Ref Range <=5.6HbA1c values of 5.7-6.4% indicate an increased risk for developingdiabetes mellitus.HbA1c values greater than or equal to 6.5% are diagnostic ofdiabetes mellitus.For diagnosis of diabetes in individuals without unequivocalhyperglycemia, results should be confirmed by repeat testing. Results- Laboratory Studies:  - Creatinine: Levels have improvedAssessment & Plan  No diagnosis found.  Health MaintenancePreventive health guidelines reviewed with patient and highlighted particular issues  pertinent to their health, gender, and age.    Discussed preventive lab testing and immunizations. See discussion below and orders for particular items discussed at today's visit.  Items discussed include (but are not limited only to) those indicated below Health care proxy / advance directives    Lifestyle modification including diet and exercise -  Skin CA awareness/prevention discussed--Sunscreen use / long sleeved clothing--Monitor moles for the following changes:  A: asymmetry B: border changes C: color changes  D: diameter greater than a pencil eraser size  E: evolving, or changing mole     Tick Safety    Gun safety discussed.Dentistry yearly advised   Eye Care every 1-2 years advised    EKG for those with HTN/significant CVD risk factors or age > 82   Colon CA screening starting at 21       Prostate cancer screening  Discussed current controversies in prostate cancer screening, including conflicting approaches taken by national guidelines, and commentators.  Discussed my understanding of the existing data, and reviewed how I believe this data may be applied to his particular situation.   Based on this discussion the patient chooses to  get tested   Vaccinations:Immunization History Administered Date(s) Administered  Covid-19 mRNA vaccine (PFIZER) IM 30 mcg/0.3mL 10/31/2019, 11/23/2019  Influenza Adult(36yr and up) 06/05/2011  Influenza Quad 0.30mL prefilled syringe/single dose vial (FluLaval ,Fluzone,Afluria,Fluarix)Historical 06/28/2012, 07/20/2015, 07/13/2016, 06/29/2017, 06/30/2018, 05/15/2019, 06/14/2020, 05/05/2021, 10/20/2022  Influenza Whole 07/08/2007, 05/04/2008, 05/06/2009  Influenza multi-dose vial historical 06/12/2013  MMR 06/14/2020  Pneumococcal Polysaccharide (Pneumovax) 06/29/2017  Td 07/05/2018  Tdap 03/02/2008 COVID Vaccination declines  Yearly flu shot advised   Pneumococcal vaccination discussed in light of 2024 updated CDC guidelines starting at age 60  [email or web address].[email or web address]  Shingrix vaccination discussed and advised for patients 37 and older    MMR or measles IGG for those born after 1957 revaccinated in 2021 Lab Results Component Value Date  MEA NEGATIVE 06/05/2020  CV Risk stratification  Discussed evolving risk stratification literature since the 2019 [facility] guidelines with as reviewed in JACC: CARDIOVASCULAR IMAGING VOL. 16, NO. 1, 2023 Major Global Coronary ArteryCalcium GuidelinesLab Results Component Value Date  CHOL 153 10/20/2022  HDL 56 10/20/2022  LDLC 86 10/20/2022  Risk calculation tool  done and discussed .  The 10-year ASCVD risk score (Arnett DK, et al., 2019) is: 7.4%  Values  used to calculate the score:    Age: 64 years    Sex: Male    Is Non-Hispanic African American: Yes    Diabetic: No    Tobacco smoker: No    Systolic Blood Pressure: 123 mmHg    Is BP treated: No    HDL Cholesterol: 56 mg/dL    Total Cholesterol: 846 mg/dLWe reviewed potential  Risk Enhancers that would suggest risk underestimated by calculator  on statin    Assessment & PlanAsthma:- Asthma is currently well-managed without any recent exacerbations.- Continue to avoid dust exposure at work to prevent exacerbations.High cholesterol:- Blood work will be conducted to assess cholesterol levels.- Currently on atorvastatin  40 mg.Vitamin D  deficiency:- A test will be conducted to check vitamin D  level.- Taking vitamin D  1000 IU daily.Colonic polyps:- Had a colonoscopy last year that showed a tubular adenoma. Due for a repeat colonoscopy in 2029.Chronic kidney disease stage 2:- Creatinine levels have improved compared to the previous year.- Continue to maintain adequate fluid intake. A test will be conducted to check creatinine level.Lateral epicondylitis and medial epicondylitis:- Tenderness in the brachioradialis muscle, along with signs of lateral and medial epicondylitis.- Anti-inflammatory creams such as Voltaren and home rehabilitation exercises were recommended. If there is no improvement, formal physical therapy may be considered.***Health MaintenancePreventive health guidelines reviewed with patient and highlighted particular issues  pertinent to their health, gender, and age.    Discussed preventive lab testing and immunizations. See discussion below and orders for particular items discussed at today's visit.  Items discussed include (but are not limited only to) those indicated below Health care proxy / advance directives  ***  Lifestyle modification including diet and exercise - ***Skin CA awareness/prevention discussed--Sunscreen use / long sleeved clothing--Monitor moles for the following changes:  A: asymmetry B: border changes C: color changes  D: diameter greater than a pencil eraser size  E: evolving, or changing mole   *** Tick Safety *** Gun safe storage review offered *** Dentistry yearly advised ***Eye Care every 1-2 years advised***Smoking cessation discussed *** Alcohol use discussed *** Recreational drug use discussed ***Safer sex/contraception discussed ***Reviewed blood work on file / discussed additional blood tests *** Hepatitis C screening for adults 18-79 *** EKG for those with HTN/significant CVD risk factors or age > 48  ***Colon CA screening starting at 63    *** Lung Cancer screening (current smokers (or have quit within the last 15 years) aged 62 to 62 years old who have a smoking history of 20 pack-years or greater (some insurers do not cover.) We reviewed decision support tool at -      https://shouldiscreen.com/English/lung-cancer-risk-calculatorWe discussed in particular:1) benefits and harms of screening, follow-up diagnostic testing, over-diagnosis, false positive rate, and total radiation exposure.2) Importance of adherence to annual lung cancer LDCT screening.3) Impact of comorbidities and ability or willingness to undergo diagnosis and treatment.4) Smoking cessation G0296 code for "Screening/Counseling/Interpreter" submitted    *** Prostate cancer screening  *** Testicular self exam discussed *** AAA screening [place] (men aged 52-75 who have smoked.)*** OB/GYN care discussed *** Osteoporosis prevention and screening discussed *** Mammogram screening discussed ***Vaccinations:Immunization History Administered Date(s) Administered  Covid-19 mRNA vaccine (PFIZER) IM 30 mcg/0.3mL 10/31/2019, 11/23/2019  Influenza Adult(26yr and  up) 06/05/2011  Influenza Quad 0.23mL prefilled syringe/single dose vial (FluLaval ,Fluzone,Afluria,Fluarix)Historical 06/28/2012, 07/20/2015, 07/13/2016, 06/29/2017, 06/30/2018, 05/15/2019, 06/14/2020, 05/05/2021, 10/20/2022  Influenza Whole 07/08/2007, 05/04/2008, 05/06/2009  Influenza multi-dose vial historical 06/12/2013  MMR 06/14/2020  Pneumococcal Polysaccharide (Pneumovax) 06/29/2017  Td 07/05/2018  Tdap 03/02/2008 COVID  Vaccination *** Yearly flu shot advised *** Pneumococcal vaccination discussed in light of 2024 updated CDC guidelines starting at age 34 ***[email or web address].[email or web address] vaccine discussed along with current ACIP recommendations as of July 2024 All patients >=75 years should receive a single dose of an RSV vaccine.All patients 60 - 74 years at increased risk of disease should receive a single dose of an RSV vaccine.As the risk of GBS and other adverse effects are incompletely understood, the vaccine is no longer recommended for lower-risk patients ages 16 - 28.This update reflects evidence from the first year of use demonstrating a reduction in RSV-related hospitalizations among vaccine recipients >=75 years and, to a lesser degree, those ages 60 - 85 with risk factors.TD every ten years or TDAP for patients less than 65 or with exposure to young children***Shingrix vaccination discussed and advised for patients 38 and older *** Hepatitis B *** -Adults aged 56 through 57 years-Adults aged 10 years and older with risk factors for hepatitis BMMR or measles IGG for those born after 1957***Lab Results Component Value Date  MEA NEGATIVE 06/05/2020 HPV vaccine  for younger adults ***CV Risk stratification  Discussed evolving risk stratification literature since the 2019 ACC guidelines with as reviewed in JACC: CARDIOVASCULAR IMAGING VOL. 16, NO. 1, 2023 Major Global Coronary  ArteryCalcium GuidelinesLab Results Component Value Date  CHOL 153 10/20/2022  HDL 56 10/20/2022  LDLC 86 10/20/2022  Risk calculation tool done and discussed . ***The 10-year ASCVD risk score (Arnett DK, et al., 2019) is: 7.4%  Values used to calculate the score:    Age: 36 years    Sex: Male    Is Non-Hispanic African American: Yes    Diabetic: No    Tobacco smoker: No    Systolic Blood Pressure: 123 mmHg    Is BP treated: No    HDL Cholesterol: 56 mg/dL    Total Cholesterol: 846 mg/dLWe reviewed potential  Risk Enhancers that would suggest risk underestimated by calculator ***  ?  Family history of early ASCVD ?  Chronic kidney disease ?  Metabolic syndrome ?   Inflammatory conditions (RA, psoriasis, HIV) ?  Ethnicity ([place]) ?  Conditions specific to women that increase CV risk such as menopause <40 years and pregnancy-associated disorders (i.e preeclampsia)We discussed possible options to intermediate risk including considering CT coronary calcium  score for those between   7.5%-20% or those  less than 7.5% and family history of premature coronary artery disease. If >10 consider medium dose statin if >100 consider high dose.    Orders and Meds Encounter Medications[2]Signed: Arley JINNY Karma, MD on 04/21/2024 at 9:26 AM  [1] AllergiesAllergen Reactions  Moxifloxacin Other (See Comments)   Penile glans swelling [2] Outpatient Encounter Medications as of 04/21/2024 Medication Sig Dispense Refill  mometasone  (NASONEX ) 50 MCG/ACT nasal spray SHAKE LIQUID AND USE 2 SPRAYS IN EACH NOSTRIL DAILY 51 g 1  albuterol  HFA (PROVENTIL , VENTOLIN ) 108 (90 Base) MCG/ACT inhaler Inhale 1-2 puffs into the lungs every 4 hours as needed for Wheezing. Shake well before each use. 1 each 3  budesonide  (PULMICORT  FLEXHALER) 180 MCG/ACT flexhaler Inhale 1 puff into the lungs 2 times daily. 1 each 3  atorvastatin  (LIPITOR) 40  mg tablet Take 1 tablet (40 mg total) by mouth daily. 90 tablet 3  cholecalciferol  (VITAMIN D ) 1,000 unit capsule Take 1 capsule (1,000 units total) by mouth daily 100 capsule 5  cetirizine  (ZYRTEC ) 10 MG tablet Take 1 tablet (10 mg total) by mouth daily.    montelukast  (SINGULAIR )  10 MG tablet Take 1 tablet (10 mg total) by mouth nightly as needed (asthma and allergies) 30 tablet 5 No facility-administered encounter medications on file as of 04/21/2024.

## 2024-04-22 ENCOUNTER — Ambulatory Visit: Payer: Self-pay | Admitting: Primary Care

## 2024-07-09 ENCOUNTER — Other Ambulatory Visit: Payer: Self-pay | Admitting: Primary Care

## 2024-07-09 DIAGNOSIS — J3089 Other allergic rhinitis: Secondary | ICD-10-CM

## 2024-07-09 MED ORDER — MOMETASONE FUROATE 50 MCG/ACT NA SUSP *I*
2.0000 | Freq: Every day | NASAL | 1 refills | Status: AC
Start: 2024-07-09 — End: ?

## 2024-07-09 NOTE — Telephone Encounter (Signed)
 Last office visit with doctor: 9/8/2025Last office visit with APP: Visit date not foundPatients upcoming appointments:Future Appointments Date Time Provider Department Center 05/27/2025  9:00 AM Raenette Arley PARAS, MD [facility] None Recent Lab results:GENERAL CHEMISTRY Recent Labs   09/08/251132 NA 140 K 4.2 CL 103 CO2 23 GAP 14 UN 10 CREAT 1.11 GLU 83 CA 8.9  LIPID PROFILE Recent Labs   09/08/251132 CHOL 139 TRIG 59 HDL 55 LDLC 72  LIVER PROFILE No value within the past 365 days DIABETES THYROID  Recent Labs   09/08/251132 HA1C 5.9*  No value within the past 365 days  Pending/Orders Labs:Lab Frequency Next Occurrence

## 2024-07-23 ENCOUNTER — Other Ambulatory Visit: Payer: Self-pay | Admitting: Primary Care

## 2024-07-23 DIAGNOSIS — E7800 Pure hypercholesterolemia, unspecified: Secondary | ICD-10-CM

## 2025-05-27 ENCOUNTER — Encounter: Admitting: Primary Care
